# Patient Record
Sex: Male | Born: 1965 | Race: Black or African American | Hispanic: No | Smoking: Never smoker
Health system: Southern US, Community
[De-identification: ages and names within clinical notes are randomized; demographics above are authoritative.]

## PROBLEM LIST (undated history)

## (undated) DIAGNOSIS — K759 Inflammatory liver disease, unspecified: Secondary | ICD-10-CM

## (undated) HISTORY — PX: OTHER SURGICAL HISTORY: SHX169

---

## 2015-04-07 ENCOUNTER — Encounter (HOSPITAL_COMMUNITY)
Admission: EM | Disposition: A | Discharge status: Home / Self Care | Payer: Self-pay | Source: Home / Self Care | Attending: Family Medicine

## 2015-04-07 ENCOUNTER — Inpatient Hospital Stay (HOSPITAL_COMMUNITY)
Admission: EM | Admit: 2015-04-07 | Discharge: 2015-04-14 | DRG: 357 | Disposition: A | Discharge status: Home / Self Care | Payer: Self-pay | Attending: Internal Medicine | Admitting: Internal Medicine

## 2015-04-07 ENCOUNTER — Emergency Department (HOSPITAL_COMMUNITY): Payer: Self-pay

## 2015-04-07 ENCOUNTER — Encounter (HOSPITAL_COMMUNITY): Payer: Self-pay

## 2015-04-07 DIAGNOSIS — K226 Gastro-esophageal laceration-hemorrhage syndrome: Principal | ICD-10-CM | POA: Diagnosis present

## 2015-04-07 DIAGNOSIS — K7469 Other cirrhosis of liver: Secondary | ICD-10-CM | POA: Diagnosis present

## 2015-04-07 DIAGNOSIS — D62 Acute posthemorrhagic anemia: Secondary | ICD-10-CM | POA: Diagnosis present

## 2015-04-07 DIAGNOSIS — K8010 Calculus of gallbladder with chronic cholecystitis without obstruction: Secondary | ICD-10-CM | POA: Diagnosis present

## 2015-04-07 DIAGNOSIS — K920 Hematemesis: Secondary | ICD-10-CM

## 2015-04-07 DIAGNOSIS — K259 Gastric ulcer, unspecified as acute or chronic, without hemorrhage or perforation: Secondary | ICD-10-CM | POA: Diagnosis present

## 2015-04-07 DIAGNOSIS — D696 Thrombocytopenia, unspecified: Secondary | ICD-10-CM

## 2015-04-07 DIAGNOSIS — K922 Gastrointestinal hemorrhage, unspecified: Secondary | ICD-10-CM

## 2015-04-07 DIAGNOSIS — K2970 Gastritis, unspecified, without bleeding: Secondary | ICD-10-CM | POA: Diagnosis present

## 2015-04-07 DIAGNOSIS — B169 Acute hepatitis B without delta-agent and without hepatic coma: Secondary | ICD-10-CM | POA: Diagnosis present

## 2015-04-07 DIAGNOSIS — K2210 Ulcer of esophagus without bleeding: Secondary | ICD-10-CM

## 2015-04-07 DIAGNOSIS — D539 Nutritional anemia, unspecified: Secondary | ICD-10-CM

## 2015-04-07 DIAGNOSIS — Z23 Encounter for immunization: Secondary | ICD-10-CM

## 2015-04-07 DIAGNOSIS — B9681 Helicobacter pylori [H. pylori] as the cause of diseases classified elsewhere: Secondary | ICD-10-CM | POA: Diagnosis present

## 2015-04-07 DIAGNOSIS — Z8249 Family history of ischemic heart disease and other diseases of the circulatory system: Secondary | ICD-10-CM

## 2015-04-07 DIAGNOSIS — R945 Abnormal results of liver function studies: Secondary | ICD-10-CM

## 2015-04-07 DIAGNOSIS — Z6841 Body Mass Index (BMI) 40.0 and over, adult: Secondary | ICD-10-CM

## 2015-04-07 DIAGNOSIS — R7989 Other specified abnormal findings of blood chemistry: Secondary | ICD-10-CM

## 2015-04-07 HISTORY — PX: ESOPHAGOGASTRODUODENOSCOPY: SHX5428

## 2015-04-07 HISTORY — DX: Inflammatory liver disease, unspecified: K75.9

## 2015-04-07 LAB — COMPREHENSIVE METABOLIC PANEL
ALT: 406 U/L — ABNORMAL HIGH (ref 17–63)
ANION GAP: 5 (ref 5–15)
AST: 749 U/L — ABNORMAL HIGH (ref 15–41)
Albumin: 2.4 g/dL — ABNORMAL LOW (ref 3.5–5.0)
Alkaline Phosphatase: 142 U/L — ABNORMAL HIGH (ref 38–126)
BILIRUBIN TOTAL: 6 mg/dL — AB (ref 0.3–1.2)
BUN: 25 mg/dL — ABNORMAL HIGH (ref 6–20)
CO2: 28 mmol/L (ref 22–32)
Calcium: 8.2 mg/dL — ABNORMAL LOW (ref 8.9–10.3)
Chloride: 105 mmol/L (ref 101–111)
Creatinine, Ser: 0.95 mg/dL (ref 0.61–1.24)
GFR calc Af Amer: >60 mL/min (ref >60)
Glucose, Bld: 130 mg/dL — ABNORMAL HIGH (ref 65–99)
POTASSIUM: 4.3 mmol/L (ref 3.5–5.1)
Sodium: 138 mmol/L (ref 135–145)
TOTAL PROTEIN: 7.5 g/dL (ref 6.5–8.1)

## 2015-04-07 LAB — CBC
HCT: 28.1 % — ABNORMAL LOW (ref 39.0–52.0)
HEMATOCRIT: 27.6 % — AB (ref 39.0–52.0)
HEMOGLOBIN: 9.4 g/dL — AB (ref 13.0–17.0)
HEMOGLOBIN: 9.2 g/dL — AB (ref 13.0–17.0)
MCH: 34.7 pg — AB (ref 26.0–34.0)
MCH: 34.7 pg — ABNORMAL HIGH (ref 26.0–34.0)
MCHC: 33.5 g/dL (ref 30.0–36.0)
MCHC: 33.3 g/dL (ref 30.0–36.0)
MCV: 103.7 fL — ABNORMAL HIGH (ref 78.0–100.0)
MCV: 104.2 fL — AB (ref 78.0–100.0)
Platelets: 101 10*3/uL — ABNORMAL LOW (ref 150–400)
Platelets: 109 10*3/uL — ABNORMAL LOW (ref 150–400)
RBC: 2.71 MIL/uL — AB (ref 4.22–5.81)
RBC: 2.65 MIL/uL — AB (ref 4.22–5.81)
RDW: 16.6 % — ABNORMAL HIGH (ref 11.5–15.5)
RDW: 16.8 % — ABNORMAL HIGH (ref 11.5–15.5)
WBC: 11.5 10*3/uL — AB (ref 4.0–10.5)
WBC: 13.7 10*3/uL — AB (ref 4.0–10.5)

## 2015-04-07 LAB — VITAMIN B12: Vitamin B-12: 782 pg/mL (ref 180–914)

## 2015-04-07 LAB — URINALYSIS, ROUTINE W REFLEX MICROSCOPIC
Glucose, UA: NEGATIVE mg/dL
HGB URINE DIPSTICK: NEGATIVE
LEUKOCYTES UA: NEGATIVE
Nitrite: NEGATIVE
PROTEIN: NEGATIVE mg/dL
Specific Gravity, Urine: 1.02 (ref 1.005–1.030)
pH: 5.5 (ref 5.0–8.0)

## 2015-04-07 LAB — I-STAT CHEM 8, ED
BUN: 27 mg/dL — ABNORMAL HIGH (ref 6–20)
CALCIUM ION: 1.12 mmol/L (ref 1.12–1.23)
CHLORIDE: 103 mmol/L (ref 101–111)
Creatinine, Ser: 0.9 mg/dL (ref 0.61–1.24)
Glucose, Bld: 121 mg/dL — ABNORMAL HIGH (ref 65–99)
HCT: 37 % — ABNORMAL LOW (ref 39.0–52.0)
HEMOGLOBIN: 12.6 g/dL — AB (ref 13.0–17.0)
Potassium: 4.4 mmol/L (ref 3.5–5.1)
SODIUM: 143 mmol/L (ref 135–145)
TCO2: 26 mmol/L (ref 0–100)

## 2015-04-07 LAB — RETICULOCYTES
RBC.: 3.2 MIL/uL — AB (ref 4.22–5.81)
RETIC COUNT ABSOLUTE: 108.8 10*3/uL (ref 19.0–186.0)
RETIC CT PCT: 3.4 % — AB (ref 0.4–3.1)

## 2015-04-07 LAB — RAPID URINE DRUG SCREEN, HOSP PERFORMED
Amphetamines: NOT DETECTED
BARBITURATES: NOT DETECTED
Benzodiazepines: NOT DETECTED
COCAINE: NOT DETECTED
Opiates: NOT DETECTED
TETRAHYDROCANNABINOL: NOT DETECTED

## 2015-04-07 LAB — CBC WITH DIFFERENTIAL/PLATELET
Basophils Absolute: 0.1 10*3/uL (ref 0.0–0.1)
Basophils Relative: 0 %
Eosinophils Absolute: 0 10*3/uL (ref 0.0–0.7)
Eosinophils Relative: 0 %
HEMATOCRIT: 33.4 % — AB (ref 39.0–52.0)
Hemoglobin: 11 g/dL — ABNORMAL LOW (ref 13.0–17.0)
LYMPHS PCT: 21 %
Lymphs Abs: 2.5 10*3/uL (ref 0.7–4.0)
MCH: 34.4 pg — ABNORMAL HIGH (ref 26.0–34.0)
MCHC: 32.9 g/dL (ref 30.0–36.0)
MCV: 104.4 fL — AB (ref 78.0–100.0)
MONO ABS: 1.1 10*3/uL — AB (ref 0.1–1.0)
MONOS PCT: 9 %
NEUTROS ABS: 8.2 10*3/uL — AB (ref 1.7–7.7)
Neutrophils Relative %: 70 %
Platelets: 132 10*3/uL — ABNORMAL LOW (ref 150–400)
RBC: 3.2 MIL/uL — ABNORMAL LOW (ref 4.22–5.81)
RDW: 16.2 % — AB (ref 11.5–15.5)
WBC: 11.8 10*3/uL — ABNORMAL HIGH (ref 4.0–10.5)

## 2015-04-07 LAB — BILIRUBIN, FRACTIONATED(TOT/DIR/INDIR)
BILIRUBIN DIRECT: 2.6 mg/dL — AB (ref 0.1–0.5)
Indirect Bilirubin: 2.3 mg/dL — ABNORMAL HIGH (ref 0.3–0.9)
Total Bilirubin: 4.9 mg/dL — ABNORMAL HIGH (ref 0.3–1.2)

## 2015-04-07 LAB — IRON AND TIBC
Iron: 249 ug/dL — ABNORMAL HIGH (ref 45–182)
Saturation Ratios: 88 % — ABNORMAL HIGH (ref 17.9–39.5)
TIBC: 281 ug/dL (ref 250–450)
UIBC: 32 ug/dL

## 2015-04-07 LAB — TYPE AND SCREEN
ABO/RH(D): O POS
ANTIBODY SCREEN: NEGATIVE

## 2015-04-07 LAB — PROTIME-INR
INR: 1.36 (ref 0.00–1.49)
PROTHROMBIN TIME: 16.9 s — AB (ref 11.6–15.2)

## 2015-04-07 LAB — ETHANOL: Alcohol, Ethyl (B): <5 mg/dL (ref <5)

## 2015-04-07 LAB — POC OCCULT BLOOD, ED: FECAL OCCULT BLD: POSITIVE — AB

## 2015-04-07 LAB — LIPASE, BLOOD: Lipase: 18 U/L (ref 11–51)

## 2015-04-07 LAB — FERRITIN: FERRITIN: 230 ng/mL (ref 24–336)

## 2015-04-07 LAB — FOLATE: FOLATE: 10.9 ng/mL (ref >5.9)

## 2015-04-07 SURGERY — EGD (ESOPHAGOGASTRODUODENOSCOPY)
Anesthesia: Moderate Sedation

## 2015-04-07 MED ORDER — BUTAMBEN-TETRACAINE-BENZOCAINE 2-2-14 % EX AERO
INHALATION_SPRAY | CUTANEOUS | Status: DC | PRN
  Administered 2015-04-07: 2 via TOPICAL

## 2015-04-07 MED ORDER — SIMETHICONE 40 MG/0.6ML PO SUSP
ORAL | Status: DC | PRN
  Administered 2015-04-07: 2.5 mL

## 2015-04-07 MED ORDER — MEPERIDINE HCL 50 MG/ML IJ SOLN
INTRAMUSCULAR | Status: AC
  Filled 2015-04-07: qty 1

## 2015-04-07 MED ORDER — MIDAZOLAM HCL 5 MG/5ML IJ SOLN
INTRAMUSCULAR | Status: DC | PRN
  Administered 2015-04-07 (×4): 2 mg via INTRAVENOUS

## 2015-04-07 MED ORDER — DEXTROSE 5 % IV SOLN
1.0000 g | INTRAVENOUS | Status: DC
  Administered 2015-04-07 – 2015-04-12 (×6): 1 g via INTRAVENOUS
  Filled 2015-04-07 (×5): qty 10

## 2015-04-07 MED ORDER — MIDAZOLAM HCL 5 MG/5ML IJ SOLN
INTRAMUSCULAR | Status: AC
  Filled 2015-04-07: qty 10

## 2015-04-07 MED ORDER — SUCRALFATE 1 GM/10ML PO SUSP
1.0000 g | Freq: Three times a day (TID) | ORAL | Status: DC
  Administered 2015-04-07 – 2015-04-14 (×27): 1 g via ORAL
  Filled 2015-04-07 (×27): qty 10

## 2015-04-07 MED ORDER — DEXTROSE 5 % IV SOLN
INTRAVENOUS | Status: AC
  Filled 2015-04-07: qty 10

## 2015-04-07 MED ORDER — SODIUM CHLORIDE 0.9% FLUSH
3.0000 mL | Freq: Two times a day (BID) | INTRAVENOUS | Status: DC
  Administered 2015-04-07 – 2015-04-13 (×10): 3 mL via INTRAVENOUS

## 2015-04-07 MED ORDER — SODIUM CHLORIDE 0.9 % IV SOLN: INTRAVENOUS | Status: DC

## 2015-04-07 MED ORDER — INFLUENZA VAC SPLIT QUAD 0.5 ML IM SUSY
0.5000 mL | PREFILLED_SYRINGE | INTRAMUSCULAR | Status: AC
  Administered 2015-04-08: 0.5 mL via INTRAMUSCULAR
  Filled 2015-04-07: qty 0.5

## 2015-04-07 MED ORDER — SODIUM CHLORIDE 0.9 % IV BOLUS (SEPSIS)
2000.0000 mL | Freq: Once | INTRAVENOUS | Status: AC
  Administered 2015-04-07: 2000 mL via INTRAVENOUS

## 2015-04-07 MED ORDER — SODIUM CHLORIDE 0.9 % IV SOLN: 250.0000 mL | INTRAVENOUS | Status: DC | PRN

## 2015-04-07 MED ORDER — MEPERIDINE HCL 50 MG/ML IJ SOLN
INTRAMUSCULAR | Status: DC | PRN
  Administered 2015-04-07 (×2): 25 mg via INTRAVENOUS

## 2015-04-07 MED ORDER — PANTOPRAZOLE SODIUM 40 MG IV SOLR
40.0000 mg | Freq: Two times a day (BID) | INTRAVENOUS | Status: DC
  Administered 2015-04-07 – 2015-04-09 (×5): 40 mg via INTRAVENOUS
  Filled 2015-04-07 (×5): qty 40

## 2015-04-07 MED ORDER — METOCLOPRAMIDE HCL 5 MG/ML IJ SOLN
10.0000 mg | Freq: Once | INTRAMUSCULAR | Status: AC
  Administered 2015-04-07: 10 mg via INTRAVENOUS
  Filled 2015-04-07: qty 2

## 2015-04-07 MED ORDER — ONDANSETRON HCL 4 MG/2ML IJ SOLN: 4.0000 mg | Freq: Four times a day (QID) | INTRAMUSCULAR | Status: DC | PRN

## 2015-04-07 MED ORDER — PANTOPRAZOLE SODIUM 40 MG IV SOLR
40.0000 mg | Freq: Once | INTRAVENOUS | Status: AC
  Administered 2015-04-07: 40 mg via INTRAVENOUS

## 2015-04-07 NOTE — ED Provider Notes (Signed)
CSN: [id number]     Arrival date & time 04/07/15  [id number] History   First MD Initiated Contact with Patient 04/07/15 0750     Chief Complaint  Patient presents with  . GI Bleeding     (Consider location/radiation/quality/duration/timing/severity/associated sxs/prior Treatment) Patient is a 50 y.o. male presenting with vomiting. The history is provided by the patient (Patient states he started vomiting blood today and had some blood per rectum yesterday).  Emesis Severity:  Moderate Timing:  Constant Emesis appearance: Dark red blood. Able to tolerate:  Liquids Progression:  Unchanged Chronicity:  New Recent urination:  Normal Context: not post-tussive   Relieved by:  Nothing Associated symptoms: no abdominal pain, no diarrhea and no headaches     History reviewed. No pertinent past medical history. History reviewed. No pertinent past surgical history. No family history on file. Social History  Substance Use Topics  . Smoking status: Never Smoker   . Smokeless tobacco: None  . Alcohol Use: No    Review of Systems  Constitutional: Negative for appetite change and fatigue.  HENT: Negative for congestion, ear discharge and sinus pressure.   Eyes: Negative for discharge.  Respiratory: Negative for cough.   Cardiovascular: Negative for chest pain.  Gastrointestinal: Positive for vomiting. Negative for abdominal pain and diarrhea.       Vomiting blood and blood per rectum  Genitourinary: Negative for frequency and hematuria.  Musculoskeletal: Negative for back pain.  Skin: Negative for rash.  Neurological: Negative for seizures and headaches.  Psychiatric/Behavioral: Negative for hallucinations.      Allergies  Review of patient's allergies indicates no known allergies.  Home Medications   Prior to Admission medications   Not on File   BP 119/69 mmHg  Pulse 94  Temp(Src) 97.7 F (36.5 C) (Oral)  Resp 18  Ht 5\' 9"  (1.753 m)  Wt 330 lb (149.687 kg)  BMI 48.71  kg/m2  SpO2 100% Physical Exam  Constitutional: He is oriented to person, place, and time. He appears well-developed.  HENT:  Head: Normocephalic.  Eyes: Conjunctivae and EOM are normal. Scleral icterus is present.  Neck: Neck supple. No thyromegaly present.  Cardiovascular: Normal rate and regular rhythm.  Exam reveals no gallop and no friction rub.   No murmur heard. Pulmonary/Chest: No stridor. He has no wheezes. He has no rales. He exhibits no tenderness.  Abdominal: He exhibits no distension. There is no tenderness. There is no rebound.  Genitourinary:  Rectal exam normal except for dark red stool stool which was heme positive  Musculoskeletal: Normal range of motion. He exhibits no edema.  Lymphadenopathy:    He has no cervical adenopathy.  Neurological: He is oriented to person, place, and time. He exhibits normal muscle tone. Coordination normal.  Skin: No rash noted. No erythema.  Psychiatric: He has a normal mood and affect. His behavior is normal.    ED Course  Procedures (including critical care time) Labs Review Labs Reviewed  CBC WITH DIFFERENTIAL/PLATELET - Abnormal; Notable for the following:    WBC 11.8 (*)    RBC 3.20 (*)    Hemoglobin 11.0 (*)    HCT 33.4 (*)    MCV 104.4 (*)    MCH 34.4 (*)    RDW 16.2 (*)    Platelets 132 (*)    Neutro Abs 8.2 (*)    Monocytes Absolute 1.1 (*)    All other components within normal limits  COMPREHENSIVE METABOLIC PANEL - Abnormal; Notable for the following:  Glucose, Bld 130 (*)    BUN 25 (*)    Calcium 8.2 (*)    Albumin 2.4 (*)    AST 749 (*)    ALT 406 (*)    Alkaline Phosphatase 142 (*)    Total Bilirubin 6.0 (*)    All other components within normal limits  URINALYSIS, ROUTINE W REFLEX MICROSCOPIC (NOT AT [facility]) - Abnormal; Notable for the following:    Bilirubin Urine MODERATE (*)    Ketones, ur TRACE (*)    All other components within normal limits  RETICULOCYTES - Abnormal; Notable for the following:     Retic Ct Pct 3.4 (*)    RBC. 3.20 (*)    All other components within normal limits  I-STAT CHEM 8, ED - Abnormal; Notable for the following:    BUN 27 (*)    Glucose, Bld 121 (*)    Hemoglobin 12.6 (*)    HCT 37.0 (*)    All other components within normal limits  POC OCCULT BLOOD, ED - Abnormal; Notable for the following:    Fecal Occult Bld POSITIVE (*)    All other components within normal limits  VITAMIN B12  FOLATE  IRON AND TIBC  FERRITIN  LIPASE, BLOOD  PROTIME-INR  HEPATITIS PANEL, ACUTE  ETHANOL  URINE RAPID DRUG SCREEN, HOSP PERFORMED  TYPE AND SCREEN    Imaging Review No results found. I have personally reviewed and evaluated these images and lab results as part of my medical decision-making.   EKG Interpretation None     CRITICAL CARE Performed by: Arlean Thies L Total critical care time: 35 minutes Critical care time was exclusive of separately billable procedures and treating other patients. Critical care was necessary to treat or prevent imminent or life-threatening deterioration. Critical care was time spent personally by me on the following activities: development of treatment plan with patient and/or surrogate as well as nursing, discussions with consultants, evaluation of patient's response to treatment, examination of patient, obtaining history from patient or surrogate, ordering and performing treatments and interventions, ordering and review of laboratory studies, ordering and review of radiographic studies, pulse oximetry and re-evaluation of patient's condition.   MDM   Final diagnoses:  UGI bleed    Patient with upper GI bleed and elevated liver studies he will be admitted to medicine    Bethann Berkshire, MD 04/07/15 1001

## 2015-04-07 NOTE — H&P (Signed)
History and Physical  Jesus [name] [name] DOB: [birth year] DOA: 04/07/2015  Referring physician: Brendia Sacksaniel Nallely Jesus PCP: No primary care provider on file.   Chief Complaint: GI bleeding  HPI:  50 year old man with no known past medical history has not been seen by a primary care physician in some time who presents with acute onset of hematemesis. Initial laboratory studies revealed mixed elevation of LFTs, anemia and thrombocytopenia. Admitted for upper GI bleed, possible choledocholithiasis.  Patient has felt well until this morning at 2 AM he woke up vomiting blood. He had one additional episode in the emergency department. No nausea. No abdominal pain. No pain with eating. No previous history of ulcer or gastrointestinal problems. No blood with bowel movements. He did notice some blood in his urine. He denies NSAID use. No alcohol or drug use.  In the emergency department afebrile, vital signs stable. Pertinent labs: Total bilirubin 6, AST 7.9, ALT 406, alkaline phosphatase 142. Hemoglobin 11.8. Alcohol level negative. Fecal occult blood positive. Urine drug screen negative. EKG: Independently reviewed. Sinus tachycardia Imaging: Chest x-Renji no acute disease, abdominal film no acute disease. Right upper quadrant ultrasound cholelithiasis without cholecystitis.  Review of Systems:  Negative for fever, visual changes, sore throat, rash, new muscle aches, chest pain, SOB, dysuria, n/abdominal pain.  History reviewed. No pertinent past medical history. Denies any medical problems  Past Surgical History  Procedure Laterality Date  . None      Social History:  reports that he has never smoked. He does not have any smokeless tobacco history on file. He reports that he does not drink alcohol or use illicit drugs.   No Known Allergies  Family History  Problem Relation Age of Onset  . Hypertension Brother      Prior to Admission medications   Not on File  Does not take any  medications  Physical Exam: Filed Vitals:   04/07/15 1045 04/07/15 1100 04/07/15 1406 04/07/15 1419  BP:  98/63 122/61 128/72  Pulse: 108 97    Temp:   98.2 F (36.8 C)   TempSrc:   Oral   Resp:   16   Height:      Weight:      SpO2: 100% 100% 100%    General:  Appears calm and comfortable Eyes: PERRL, normal lids, irises & conjunctiva ENT: grossly normal hearing, lips & tongue Neck: no LAD, masses or thyromegaly Cardiovascular: RRR, no m/r/g. No LE edema. Respiratory: CTA bilaterally, no w/r/r. Normal respiratory effort. Abdomen: soft, Obese, nontender, no right upper quadrant pain Skin: no rash or induration noted Musculoskeletal: grossly normal tone BUE/BLE Psychiatric: grossly normal mood and affect, speech fluent and appropriate Neurologic: grossly non-focal.   Wt Readings from Last 3 Encounters:  04/07/15 149.687 kg (330 lb)    Labs on Admission:  Basic Metabolic Panel:  Recent Labs Lab 04/07/15 0755 04/07/15 0846  NA 138 143  K 4.3 4.4  CL 105 103  CO2 28  --   GLUCOSE 130* 121*  BUN 25* 27*  CREATININE 0.95 0.90  CALCIUM 8.2*  --     Liver Function Tests:  Recent Labs Lab 04/07/15 0755  AST 749*  ALT 406*  ALKPHOS 142*  BILITOT 6.0*  PROT 7.5  ALBUMIN 2.4*    Recent Labs Lab 04/07/15 0755  LIPASE 18    CBC:  Recent Labs Lab 04/07/15 0755 04/07/15 0846  WBC 11.8*  --   NEUTROABS 8.2*  --   HGB 11.0* 12.6*  HCT 33.4*  37.0*  MCV 104.4*  --   PLT 132*  --     Radiological Exams on Admission: Dg Abd Acute W/chest  04/07/2015  CLINICAL DATA:  Hematuria, vomiting blood, fever EXAM: DG ABDOMEN ACUTE W/ 1V CHEST COMPARISON:  None. FINDINGS: No active infiltrate or effusion is seen. Mediastinal and hilar contours are unremarkable. The heart is borderline enlarged. Supine and erect views of the abdomen show no bowel obstruction. No free air is seen on the best erect view possible. Calcifications are present within the right upper  quadrant which could represent calcified gallstones, not appearing to overlay the right renal shadow. There are degenerative changes in the lower lumbar spine. IMPRESSION: 1. No bowel obstruction.  No free air on the best films possible. 2. Question gallstones in the right upper quadrant. 3. No active lung disease. Electronically Signed   By: Dwyane Dee M.D.   On: 04/07/2015 10:36   US Abdomen Limited Ruq  04/07/2015  CLINICAL DATA:  50 year old male the history of mid abdominal pain and rectal bleeding. EXAM: US ABDOMEN LIMITED - RIGHT UPPER QUADRANT COMPARISON:  Plain film 04/07/2015 FINDINGS: Gallbladder: Multiple echogenic foci within the lumen of the gallbladder. Gallbladder wall measures approximately 4 mm -5 mm. No pericholecystic fluid. Sonographic Murphy's sign negative. Common bile duct: Diameter: 5 mm Liver: Heterogeneous echotexture of the liver. IMPRESSION: Sonographic survey demonstrates cholelithiasis without sonographic evidence of acute cholecystitis. Liver steatosis. Signed, Yvone Neu. Loreta Ave, DO [facility] Electronically Signed   By: Gilmer Mor D.O.   On: 04/07/2015 11:05      Principal Problem:   UGI bleed Active Problems:   Hematemesis   Elevated LFTs   Thrombocytopenia (HCC)   Macrocytic anemia   Assessment/Plan 1. Hematemesis, suspected UGIB. Hgb 12.6, hemodynamics stable. Fecal occult blood positive. 2. Elevated LFTs: mixed pattern, transaminases, AP and total bilirubin. Consider choledocholithiasis. Right upper quadrant ultrasound showed cholelithiasis without cholecystitis. Acute abdominal series unremarkable. Lipase normal. 3. Macrocytic anemia, thrombocytopenia. Baseline unknown. Possibly related to underlying liver disease/fatty liver?   Obs to medical bed  Trend hemoglobin  Check acute hepatitis panel, alcohol level  GI consult  Code Status: full code  DVT prophylaxis:SCDs Family  Communication: brother with patient permission Disposition Plan/Anticipated LOS: discharge once improved  Time spent: 80 minutes  Brendia Sacks, MD  [facility] Pager [phone] 04/07/2015, 2:21 PM  By signing my name below, I, Adron Bene, attest that this documentation has been prepared under the direction and in the presence of Neriyah Cercone P. Irene Limbo, MD. Electronically Signed: Adron Bene, Scribe.  04/07/2015

## 2015-04-07 NOTE — Consult Note (Addendum)
Referring Provider: Melton Alar.Irene Limbo, MD Primary Care Physician:  No primary care provider on file. Primary Gastroenterologist:  Dr. Karilyn Cota  Reason for Consultation:   Upper GI bleed and abnormal LFTs.  HPI:   Patient is 50 year old African male who has been usual state of health until early this morning when he woke up around 2 AM to avoid and felt sick. He had nausea vomiting and noted dark red blood. Around the same time he noted upper and mid abdominal pain. He noticed urine to be orange. He had one or 2 more episodes of emesis. Felt weak. He came to emergency room around 8 AM. He had no episode of hematemesis while in emergency room. He was noted to have low hemoglobin. He was also noted to have elevated troponin LFTs. Upper abdominal ultrasound was obtained which suggested fatty liver and cholelithiasis. Gallbladder wall is thickened. Bile duct was nondilated. Patient was admitted to ICU for further management. Patient not has mild pain in epigastric region. He denies chronic heartburn or dysphagia. He has had good appetite and has not lost any weight recently. He does not take OTC NSAIDs or other medications. He did have bowel movement this morning before he came to the hospital. Was black formed stool. He states he felt well all day yesterday and has 3 meals. He does not drink alcohol or smoke cigarettes. He is divorced. He has been working as a Naval architect since 8119. He has 3 grownup children. He has 2 sons and daughter. His daughter Ezequiel Essex is at bedside. His father died at age 81 1 week after sustaining abdominal injury at salt meal.. He apparently had internal injury. Her mother died of MI at age 5. He has 2 brothers and 4 sisters. One brother is hypertensive. One brother and one sister has been treated for peptic ulcer disease/upper GI bleed.   Past medical history; Obesity.  Past Surgical History  Procedure Laterality Date  . None      Prior to Admission medications    Not on File    Current Facility-Administered Medications  Medication Dose Route Frequency Provider Last Rate Last Dose  . [START ON 04/08/2015] Influenza vac split quadrivalent PF (FLUARIX) injection 0.5 mL  0.5 mL Intramuscular Tomorrow-1000 Standley Brooking, MD        Allergies as of 04/07/2015  . (No Known Allergies)    Family History  Problem Relation Age of Onset  . Hypertension Brother     Social History   Social History  . Marital Status: Married    Spouse Name: N/A  . Number of Children: N/A  . Years of Education: N/A   Occupational History  . Not on file.   Social History Main Topics  . Smoking status: Never Smoker   . Smokeless tobacco: Not on file  . Alcohol Use: No  . Drug Use: No  . Sexual Activity: Not on file   Other Topics Concern  . Not on file   Social History Narrative  . No narrative on file    Review of Systems: See HPI, otherwise normal ROS  Physical Exam: Temp:  [97.7 F (36.5 C)] 97.7 F (36.5 C) (03/15 0733) Pulse Rate:  [92-108] 97 (03/15 1100) Resp:  [18-20] 18 (03/15 0944) BP: (98-122)/(63-72) 98/63 mmHg (03/15 1100) SpO2:  [99 %-100 %] 100 % (03/15 1100) Weight:  [330 lb (149.687 kg)] 330 lb (149.687 kg) (03/15 0733) Last BM Date: 04/07/15 Pleasant well-developed these have her medically male in NAD. Conjunctiva was  pink. Sclerae mildly icteric. Oropharyngeal mucosa is normal. Neck masses or thyromegaly noted. Cardiac exam with regular rhythm normal S1 and S2. No murmur or gallop noted. Abdomen is full with normal bowel sounds. On palpation it soft. He has mild midepigastric tenderness. No organomegaly or masses. Rectal examination deferred.   Lab Results:  Recent Labs  04/07/15 0755 04/07/15 0846  WBC 11.8*  --   HGB 11.0* 12.6*  HCT 33.4* 37.0*  PLT 132*  --    BMET  Recent Labs  04/07/15 0755 04/07/15 0846  NA 138 143  K 4.3 4.4  CL 105 103  CO2 28  --   GLUCOSE 130* 121*  BUN 25* 27*  CREATININE  0.95 0.90  CALCIUM 8.2*  --    LFT  Recent Labs  04/07/15 0755  PROT 7.5  ALBUMIN 2.4*  AST 749*  ALT 406*  ALKPHOS 142*  BILITOT 6.0*   PT/INR  Recent Labs  04/07/15 0755  LABPROT 16.9*  INR 1.36   Hepatitis Panel Pending..  Serum lipase 18 INR 1.36.  Studies/Results: Dg Abd Acute W/chest  04/07/2015  CLINICAL DATA:  Hematuria, vomiting blood, fever EXAM: DG ABDOMEN ACUTE W/ 1V CHEST COMPARISON:  None. FINDINGS: No active infiltrate or effusion is seen. Mediastinal and hilar contours are unremarkable. The heart is borderline enlarged. Supine and erect views of the abdomen show no bowel obstruction. No free air is seen on the best erect view possible. Calcifications are present within the right upper quadrant which could represent calcified gallstones, not appearing to overlay the right renal shadow. There are degenerative changes in the lower lumbar spine. IMPRESSION: 1. No bowel obstruction.  No free air on the best films possible. 2. Question gallstones in the right upper quadrant. 3. No active lung disease. Electronically Signed   By: Dwyane Dee M.D.   On: 04/07/2015 10:36   US Abdomen Limited Ruq  04/07/2015  CLINICAL DATA:  50 year old male the history of mid abdominal pain and rectal bleeding. EXAM: US ABDOMEN LIMITED - RIGHT UPPER QUADRANT COMPARISON:  Plain film 04/07/2015 FINDINGS: Gallbladder: Multiple echogenic foci within the lumen of the gallbladder. Gallbladder wall measures approximately 4 mm -5 mm. No pericholecystic fluid. Sonographic Murphy's sign negative. Common bile duct: Diameter: 5 mm Liver: Heterogeneous echotexture of the liver. IMPRESSION: Sonographic survey demonstrates cholelithiasis without sonographic evidence of acute cholecystitis. Liver steatosis. Signed, Yvone Neu. Loreta Ave, DO [facility] Electronically Signed   By: Gilmer Mor D.O.   On: 04/07/2015 11:05   Ultrasound reviewed with Dr.  Ulyses Southward.  Assessment;  Patient is 50 year old African American male who presents with acute onset of nausea vomiting hematemesis melena and also noted to have elevated bilirubin and transaminases. Ultrasound reveals cholelithiasis but CBD is not dilated. Patient does not take OTC medications. He also does not drink alcohol. I suspect acute illness is secondary to choledocholithiasis resulting in nausea vomiting resulting in Mallory-Weiss tear and upper GI bleed. Peptic ulcer disease is also possible. Since CBD is nondilated it is quite possible that he did have passed a stone. Whether or not he would need further imaging studies are ERCP will depend on his lab studies from tomorrow morning. Gallbladder wall is thickened and he therefore may also have acute cholecystitis.    Recommendations;  Pantoprazole 40 mg IV every 12 hours. Ceftriaxone 1 g IV every 24 hours. Diagnostic esophagogastroduodenoscopy today. Surgical consultation. Repeat LFTs in a.m.     Priya Matsen U  04/07/2015,  1:01 PM

## 2015-04-07 NOTE — ED Notes (Signed)
Pt reports dark stools for the past few days and started vomiting dark red blood yesterday.  Reports had to give a urine sample yesterday for a job and was told he also had blood in urine.  Pt denies pain but c/o generalized weakness.

## 2015-04-07 NOTE — ED Notes (Signed)
Pt had 2 episodes dark red hematemesis.

## 2015-04-07 NOTE — Op Note (Signed)
[facility] Patient Name: Jesus [name] Procedure Date: 04/07/2015 2:45 PM MRN: [id number] Date of Birth: 1966/01/16 Attending MD: Lionel December , MD CSN: [id number] Age: 50 Admit Type: Inpatient Procedure:                Upper GI endoscopy Indications:              Active gastrointestinal bleeding Providers:                Lionel December, MD, Nena Polio, RN, Birder Robson,                            Technician Referring MD:              Medicines:                Cetacaine spray, Meperidine 50 mg IV Complications:            No immediate complications. Estimated Blood Loss:     Estimated blood loss: none. Estimated blood loss:                            none. Procedure:                Pre-Anesthesia Assessment:                           - Prior to the procedure, a History and Physical                            was performed, and patient medications and                            allergies were reviewed. The patient's tolerance of                            previous anesthesia was also reviewed. The risks                            and benefits of the procedure and the sedation                            options and risks were discussed with the patient.                            All questions were answered, and informed consent                            was obtained. Prior Anticoagulants: The patient has                            taken no previous anticoagulant or antiplatelet                            agents. ASA Grade Assessment: I - A normal, healthy  patient. After reviewing the risks and benefits,                            the patient was deemed in satisfactory condition to                            undergo the procedure.                           After obtaining informed consent, the endoscope was                            passed under direct vision. Throughout the                            procedure, the patient's blood pressure, pulse,  and                            oxygen saturations were monitored continuously. The                            [id number] ([id number] scope was introduced through the                            mouth, and advanced to the second part of duodenum.                            The upper GI endoscopy was accomplished without                            difficulty. The patient tolerated the procedure                            well. Scope In: 3:09:00 PM Scope Out: 3:14:06 PM Total Procedure Duration: 0 hours 5 minutes 6 seconds  Findings:      One linear esophageal ulcer with no bleeding and no stigmata of recent       bleeding was found 41 cm from the incisors. The lesion was 15 mm in       largest dimension.      The upper third of the esophagus and middle third of the esophagus were       normal. Estimated blood loss: none.      Three non-bleeding superficial gastric ulcers with no stigmata of       bleeding were found in the gastric antrum. The largest lesion was 4 mm       in largest dimension.      One non-bleeding cratered gastric ulcer with no stigmata of bleeding was       found in the prepyloric region of the stomach. The lesion was 10 mm in       largest dimension.      The cardia and gastric fundus were normal on retroflexion.      The duodenal bulb and second portion of the duodenum were normal. Impression:               - linear esophageal ulceracross GE  junction to his                            lesser curvature consistent with Mallory-Weiss tear.                           - Normal upper third of esophagus and middle third                            of esophagus.                           - Three small antral ulcers and10 mm prepyloric                            ulcer noted without stigmata of bleed.                           - No specimens collected. Moderate Sedation:      Moderate (conscious) sedation was administered by the endoscopy nurse       and supervised by the  endoscopist. The following parameters were       monitored: oxygen saturation, heart rate, blood pressure, CO2       capnography and response to care. Total physician intraservice time was       15 minutes. Recommendation:           - Clear liquid diet today.                           - Use sucralfate suspension 1 gram PO QID today.                           - Return patient to ICU for ongoing care.                           - Perform an H. pylori serology today.                           - No aspirin, ibuprofen, naproxen, or other                            non-steroidal anti-inflammatory drugs for 2 weeks.                           - Repeat upper endoscopy in 3 months to check                            healing. Procedure Code(s):        --- Professional ---                           [phone], Esophagogastroduodenoscopy, flexible,                            transoral; diagnostic, including collection of  specimen(s) by brushing or washing, when performed                            (separate procedure)                           99152, Moderate sedation services provided by the                            same physician or other qualified health care                            professional performing the diagnostic or                            therapeutic service that the sedation supports,                            requiring the presence of an independent trained                            observer to assist in the monitoring of the                            patient's level of consciousness and physiological                            status; initial 15 minutes of intraservice time,                            patient age 19 years or older Diagnosis Code(s):        --- Professional ---                           K22.10, Ulcer of esophagus without bleeding                           K92.2, Gastrointestinal hemorrhage, unspecified CPT copyright 2016 American Medical  Association. All rights reserved. The codes documented in this report are preliminary and upon coder review may  be revised to meet current compliance requirements. Lionel December, MD Lionel December, MD 04/07/2015 3:44:10 PM This report has been signed electronically. Number of Addenda: 0

## 2015-04-08 DIAGNOSIS — K2210 Ulcer of esophagus without bleeding: Secondary | ICD-10-CM

## 2015-04-08 LAB — CBC
HCT: 27.9 % — ABNORMAL LOW (ref 39.0–52.0)
HCT: 26.4 % — ABNORMAL LOW (ref 39.0–52.0)
HEMOGLOBIN: 9.4 g/dL — AB (ref 13.0–17.0)
Hemoglobin: 8.9 g/dL — ABNORMAL LOW (ref 13.0–17.0)
MCH: 34.8 pg — ABNORMAL HIGH (ref 26.0–34.0)
MCH: 35.3 pg — ABNORMAL HIGH (ref 26.0–34.0)
MCHC: 33.7 g/dL (ref 30.0–36.0)
MCHC: 33.7 g/dL (ref 30.0–36.0)
MCV: 103.3 fL — ABNORMAL HIGH (ref 78.0–100.0)
MCV: 104.8 fL — AB (ref 78.0–100.0)
PLATELETS: 111 10*3/uL — AB (ref 150–400)
PLATELETS: 106 10*3/uL — AB (ref 150–400)
RBC: 2.7 MIL/uL — ABNORMAL LOW (ref 4.22–5.81)
RBC: 2.52 MIL/uL — AB (ref 4.22–5.81)
RDW: 16.9 % — AB (ref 11.5–15.5)
RDW: 17 % — AB (ref 11.5–15.5)
WBC: 11 10*3/uL — ABNORMAL HIGH (ref 4.0–10.5)
WBC: 9.5 10*3/uL (ref 4.0–10.5)

## 2015-04-08 LAB — COMPREHENSIVE METABOLIC PANEL
ALBUMIN: 2.1 g/dL — AB (ref 3.5–5.0)
ALK PHOS: 113 U/L (ref 38–126)
ALT: 318 U/L — AB (ref 17–63)
ANION GAP: 4 — AB (ref 5–15)
AST: 611 U/L — ABNORMAL HIGH (ref 15–41)
BILIRUBIN TOTAL: 4.7 mg/dL — AB (ref 0.3–1.2)
BUN: 21 mg/dL — ABNORMAL HIGH (ref 6–20)
CALCIUM: 7.9 mg/dL — AB (ref 8.9–10.3)
CO2: 27 mmol/L (ref 22–32)
CREATININE: 0.97 mg/dL (ref 0.61–1.24)
Chloride: 107 mmol/L (ref 101–111)
GFR calc Af Amer: >60 mL/min (ref >60)
GFR calc non Af Amer: >60 mL/min (ref >60)
GLUCOSE: 100 mg/dL — AB (ref 65–99)
Potassium: 3.8 mmol/L (ref 3.5–5.1)
Sodium: 138 mmol/L (ref 135–145)
TOTAL PROTEIN: 6.3 g/dL — AB (ref 6.5–8.1)

## 2015-04-08 LAB — H. PYLORI ANTIBODY, IGG: H Pylori IgG: 7.4 U/mL — ABNORMAL HIGH (ref 0.0–0.8)

## 2015-04-08 LAB — HEPATITIS PANEL, ACUTE
HCV Ab: 0.2 s/co ratio (ref 0.0–0.9)
HEP A IGM: NEGATIVE
HEP B C IGM: NEGATIVE
HEP B S AG: POSITIVE — AB

## 2015-04-08 NOTE — Progress Notes (Addendum)
States he feels 50% better. Did have a black stool yesterday evening which was expected. Underwent an EGD yesterday by Dr. Laural Golden which revealed linear esophageal ulcer across GE junction to his lesser curvature consistent with Mallory-Weiss tear. Normal upper third of esophagus and middle third of esophagus. Three small ulcers and 10 mm prepyloric ulcer noted without stigmata of bleed.  This morning he has  nausea or vomiting. He has slight epigastric tenderness but abdomen is non-distended. Liver numbers are slowly coming down. He deneis prior hx of IV drugs.   Hepatic Function Latest Ref Rng 04/08/2015 04/07/2015 04/07/2015  Total Protein 6.5 - 8.1 g/dL 6.3(L) - 7.5  Albumin 3.5 - 5.0 g/dL 2.1(L) - 2.4(L)  AST 15 - 41 U/L 611(H) - 749(H)  ALT 17 - 63 U/L 318(H) - 406(H)  Alk Phosphatase 38 - 126 U/L 113 - 142(H)  Total Bilirubin 0.3 - 1.2 mg/dL 4.7(H) 4.9(H) 6.0(H)  Bilirubin, Direct 0.1 - 0.5 mg/dL - 2.6(H) -   Blood pressure 122/58, pulse 88, temperature 98.2 F (36.8 C), temperature source Oral, resp. rate 20, height 5' 9"  (1.753 m), weight 333 lb 5.4 oz (151.2 kg), SpO2 100 %. CBC Latest Ref Rng 04/08/2015 04/07/2015 04/07/2015  WBC 4.0 - 10.5 K/uL 11.0(H) 13.7(H) 11.5(H)  Hemoglobin 13.0 - 17.0 g/dL 9.4(L) 9.2(L) 9.4(L)  Hematocrit 39.0 - 52.0 % 27.9(L) 27.6(L) 28.1(L)  Platelets 150 - 400 K/uL 111(L) 109(L) 101(L)   UGI Bleed: He says he is 50% better at this time. Underwent EGD yesterday. H. pylori positive. Will start tx at [place].  Cholelithiasis: He possible passed a stone.  Liver enzymes are slowly coming down. Acute Hepatitis panel is pending. Will continue to monitor.      GI attending note:  As above patient is feeling better. He denies nausea vomiting or abdominal pain. He does not feel as weak as he did yesterday. He stated stool distal black. Bilirubin and transaminases have improved since yesterday. Serum albumin is 21 and his platelet count is low. He therefore could have  underlying liver disease or cirrhosis. Vital marker studies are pending. He could have chronic liver disease secondary to NASH. Will ask Dr. Arnoldo Morale to do liver biopsy at the time of surgery. H. pylori serology is positive. He will be treated at a later date.  Will advance diet to full liquids. Continue IV pantoprazole for another 24 hours. Patient for repeat labs in a.m.

## 2015-04-08 NOTE — Clinical Social Work Note (Signed)
CSW left a voicemail message for Fransisca ConnorsAshley Hilton, financial counselor, advising that patient's family wanted to discuss applying for Medicaid.     CSW signing off.  Reis Goga, Juleen ChinaHeather D, LCSW

## 2015-04-08 NOTE — Consult Note (Signed)
Reason for Consult: Cholecystitis, cholelithiasis Referring Physician: Hospitalist  Jesus [name] is an 50 y.o. male.  HPI: Patient is a 50 year old morbidly obese black male who presented emergency room with worsening nausea and vomiting. He was also noted to have hematemesis and anemia. His liver enzyme tests were significantly elevated. Workup reveals cholelithiasis with possible dilated common bile duct. Due to the hematemesis and low hemoglobin, he underwent an EGD by Dr. Laural Golden yesterday. No frank bleeding was noted. Today, he has less nausea and no abdominal pain. His liver enzyme tests are slowly improving. I agree that he may have passed a stone. Patient gives no pertinent past medical history.  History reviewed. No pertinent past medical history.  Past Surgical History  Procedure Laterality Date  . None      Family History  Problem Relation Age of Onset  . Hypertension Brother     Social History:  reports that he has never smoked. He does not have any smokeless tobacco history on file. He reports that he does not drink alcohol or use illicit drugs.  Allergies: No Known Allergies  Medications: I have reviewed the patient's current medications.  Results for orders placed or performed during the hospital encounter of 04/07/15 (from the past 48 hour(s))  CBC with Differential     Status: Abnormal   Collection Time: 04/07/15  7:55 AM  Result Value Ref Range   WBC 11.8 (H) 4.0 - 10.5 K/uL   RBC 3.20 (L) 4.22 - 5.81 MIL/uL   Hemoglobin 11.0 (L) 13.0 - 17.0 g/dL   HCT 33.4 (L) 39.0 - 52.0 %   MCV 104.4 (H) 78.0 - 100.0 fL   MCH 34.4 (H) 26.0 - 34.0 pg   MCHC 32.9 30.0 - 36.0 g/dL   RDW 16.2 (H) 11.5 - 15.5 %   Platelets 132 (L) 150 - 400 K/uL   Neutrophils Relative % 70 %   Neutro Abs 8.2 (H) 1.7 - 7.7 K/uL   Lymphocytes Relative 21 %   Lymphs Abs 2.5 0.7 - 4.0 K/uL   Monocytes Relative 9 %   Monocytes Absolute 1.1 (H) 0.1 - 1.0 K/uL   Eosinophils Relative 0 %   Eosinophils Absolute 0.0 0.0 - 0.7 K/uL   Basophils Relative 0 %   Basophils Absolute 0.1 0.0 - 0.1 K/uL  Comprehensive metabolic panel     Status: Abnormal   Collection Time: 04/07/15  7:55 AM  Result Value Ref Range   Sodium 138 135 - 145 mmol/L   Potassium 4.3 3.5 - 5.1 mmol/L   Chloride 105 101 - 111 mmol/L   CO2 28 22 - 32 mmol/L   Glucose, Bld 130 (H) 65 - 99 mg/dL   BUN 25 (H) 6 - 20 mg/dL   Creatinine, Ser 0.95 0.61 - 1.24 mg/dL   Calcium 8.2 (L) 8.9 - 10.3 mg/dL   Total Protein 7.5 6.5 - 8.1 g/dL   Albumin 2.4 (L) 3.5 - 5.0 g/dL   AST 749 (H) 15 - 41 U/L   ALT 406 (H) 17 - 63 U/L   Alkaline Phosphatase 142 (H) 38 - 126 U/L   Total Bilirubin 6.0 (H) 0.3 - 1.2 mg/dL   GFR calc non Af Amer >60 >60 mL/min   GFR calc Af Amer >60 >60 mL/min    Comment: (NOTE) The eGFR has been calculated using the CKD EPI equation. This calculation has not been validated in all clinical situations. eGFR's persistently <60 mL/min signify possible Chronic Kidney Disease.  Anion gap 5 5 - 15  Type and screen     Status: None   Collection Time: 04/07/15  7:55 AM  Result Value Ref Range   ABO/RH(D) O POS    Antibody Screen NEG    Sample Expiration 04/10/2015   Vitamin B12     Status: None   Collection Time: 04/07/15  7:55 AM  Result Value Ref Range   Vitamin B-12 782 180 - 914 pg/mL    Comment: (NOTE) This assay is not validated for testing neonatal or myeloproliferative syndrome specimens for Vitamin B12 levels. Performed at [facility]   Iron and TIBC     Status: Abnormal   Collection Time: 04/07/15  7:55 AM  Result Value Ref Range   Iron 249 (H) 45 - 182 ug/dL   TIBC 281 250 - 450 ug/dL   Saturation Ratios 88 (H) 17.9 - 39.5 %   UIBC 32 ug/dL    Comment: Performed at [facility]  Ferritin     Status: None   Collection Time: 04/07/15  7:55 AM  Result Value Ref Range   Ferritin 230 24 - 336 ng/mL    Comment: Performed at [facility]  Reticulocytes      Status: Abnormal   Collection Time: 04/07/15  7:55 AM  Result Value Ref Range   Retic Ct Pct 3.4 (H) 0.4 - 3.1 %   RBC. 3.20 (L) 4.22 - 5.81 MIL/uL   Retic Count, Manual 108.8 19.0 - 186.0 K/uL  Lipase, blood     Status: None   Collection Time: 04/07/15  7:55 AM  Result Value Ref Range   Lipase 18 11 - 51 U/L  Protime-INR     Status: Abnormal   Collection Time: 04/07/15  7:55 AM  Result Value Ref Range   Prothrombin Time 16.9 (H) 11.6 - 15.2 seconds   INR 1.36 0.00 - 1.49  Ethanol     Status: None   Collection Time: 04/07/15  7:55 AM  Result Value Ref Range   Alcohol, Ethyl (B) <5 <5 mg/dL    Comment:        LOWEST DETECTABLE LIMIT FOR SERUM ALCOHOL IS 5 mg/dL FOR MEDICAL PURPOSES ONLY   Urinalysis, Routine w reflex microscopic (not at [facility])     Status: Abnormal   Collection Time: 04/07/15  8:18 AM  Result Value Ref Range   Color, Urine YELLOW YELLOW   APPearance CLEAR CLEAR   Specific Gravity, Urine 1.020 1.005 - 1.030   pH 5.5 5.0 - 8.0   Glucose, UA NEGATIVE NEGATIVE mg/dL   Hgb urine dipstick NEGATIVE NEGATIVE   Bilirubin Urine MODERATE (A) NEGATIVE   Ketones, ur TRACE (A) NEGATIVE mg/dL   Protein, ur NEGATIVE NEGATIVE mg/dL   Nitrite NEGATIVE NEGATIVE   Leukocytes, UA NEGATIVE NEGATIVE    Comment: MICROSCOPIC NOT DONE ON URINES WITH NEGATIVE PROTEIN, BLOOD, LEUKOCYTES, NITRITE, OR GLUCOSE <1000 mg/dL.  Folate     Status: None   Collection Time: 04/07/15  8:23 AM  Result Value Ref Range   Folate 10.9 >5.9 ng/mL    Comment: Performed at [facility]  POC occult blood, ED     Status: Abnormal   Collection Time: 04/07/15  8:33 AM  Result Value Ref Range   Fecal Occult Bld POSITIVE (A) NEGATIVE  I-stat chem 8, ed     Status: Abnormal   Collection Time: 04/07/15  8:46 AM  Result Value Ref Range   Sodium 143 135 -  145 mmol/L   Potassium 4.4 3.5 - 5.1 mmol/L   Chloride 103 101 - 111 mmol/L   BUN 27 (H) 6 - 20 mg/dL   Creatinine, Ser 0.90 0.61 - 1.24 mg/dL    Glucose, Bld 121 (H) 65 - 99 mg/dL   Calcium, Ion 1.12 1.12 - 1.23 mmol/L   TCO2 26 0 - 100 mmol/L   Hemoglobin 12.6 (L) 13.0 - 17.0 g/dL   HCT 37.0 (L) 39.0 - 52.0 %  Urine rapid drug screen (hosp performed)     Status: None   Collection Time: 04/07/15  9:50 AM  Result Value Ref Range   Opiates NONE DETECTED NONE DETECTED   Cocaine NONE DETECTED NONE DETECTED   Benzodiazepines NONE DETECTED NONE DETECTED   Amphetamines NONE DETECTED NONE DETECTED   Tetrahydrocannabinol NONE DETECTED NONE DETECTED   Barbiturates NONE DETECTED NONE DETECTED    Comment:        DRUG SCREEN FOR MEDICAL PURPOSES ONLY.  IF CONFIRMATION IS NEEDED FOR ANY PURPOSE, NOTIFY LAB WITHIN 5 DAYS.        LOWEST DETECTABLE LIMITS FOR URINE DRUG SCREEN Drug Class       Cutoff (ng/mL) Amphetamine      1000 Barbiturate      200 Benzodiazepine   517 Tricyclics       616 Opiates          300 Cocaine          300 THC              50   H. pylori antibody, IgG     Status: Abnormal   Collection Time: 04/07/15  4:00 PM  Result Value Ref Range   H Pylori IgG 7.4 (H) 0.0 - 0.8 U/mL    Comment: (NOTE)                             Negative            <0.9                             Indeterminate  0.9 - 1.0                             Positive            >1.0 Performed At: [facility] [place], [place] [id number] Lindon Romp MD [id number]   Bilirubin, fractionated(tot/dir/indir)     Status: Abnormal   Collection Time: 04/07/15  4:00 PM  Result Value Ref Range   Total Bilirubin 4.9 (H) 0.3 - 1.2 mg/dL   Bilirubin, Direct 2.6 (H) 0.1 - 0.5 mg/dL   Indirect Bilirubin 2.3 (H) 0.3 - 0.9 mg/dL  CBC     Status: Abnormal   Collection Time: 04/07/15  4:00 PM  Result Value Ref Range   WBC 11.5 (H) 4.0 - 10.5 K/uL   RBC 2.71 (L) 4.22 - 5.81 MIL/uL   Hemoglobin 9.4 (L) 13.0 - 17.0 g/dL    Comment: DELTA CHECK NOTED   HCT 28.1 (L) 39.0 - 52.0 %   MCV 103.7 (H) 78.0 - 100.0 fL   MCH 34.7  (H) 26.0 - 34.0 pg   MCHC 33.5 30.0 - 36.0 g/dL   RDW 16.6 (H) 11.5 - 15.5 %   Platelets 101 (L) 150 - 400  K/uL    Comment: SPECIMEN CHECKED FOR CLOTS CONSISTENT WITH PREVIOUS RESULT   CBC     Status: Abnormal   Collection Time: 04/07/15  8:39 PM  Result Value Ref Range   WBC 13.7 (H) 4.0 - 10.5 K/uL   RBC 2.65 (L) 4.22 - 5.81 MIL/uL   Hemoglobin 9.2 (L) 13.0 - 17.0 g/dL   HCT 27.6 (L) 39.0 - 52.0 %   MCV 104.2 (H) 78.0 - 100.0 fL   MCH 34.7 (H) 26.0 - 34.0 pg   MCHC 33.3 30.0 - 36.0 g/dL   RDW 16.8 (H) 11.5 - 15.5 %   Platelets 109 (L) 150 - 400 K/uL    Comment: SPECIMEN CHECKED FOR CLOTS CONSISTENT WITH PREVIOUS RESULT   Comprehensive metabolic panel     Status: Abnormal   Collection Time: 04/08/15  2:15 AM  Result Value Ref Range   Sodium 138 135 - 145 mmol/L   Potassium 3.8 3.5 - 5.1 mmol/L   Chloride 107 101 - 111 mmol/L   CO2 27 22 - 32 mmol/L   Glucose, Bld 100 (H) 65 - 99 mg/dL   BUN 21 (H) 6 - 20 mg/dL   Creatinine, Ser 0.97 0.61 - 1.24 mg/dL   Calcium 7.9 (L) 8.9 - 10.3 mg/dL   Total Protein 6.3 (L) 6.5 - 8.1 g/dL   Albumin 2.1 (L) 3.5 - 5.0 g/dL   AST 611 (H) 15 - 41 U/L   ALT 318 (H) 17 - 63 U/L   Alkaline Phosphatase 113 38 - 126 U/L   Total Bilirubin 4.7 (H) 0.3 - 1.2 mg/dL   GFR calc non Af Amer >60 >60 mL/min   GFR calc Af Amer >60 >60 mL/min    Comment: (NOTE) The eGFR has been calculated using the CKD EPI equation. This calculation has not been validated in all clinical situations. eGFR's persistently <60 mL/min signify possible Chronic Kidney Disease.    Anion gap 4 (L) 5 - 15  CBC     Status: Abnormal   Collection Time: 04/08/15  2:15 AM  Result Value Ref Range   WBC 11.0 (H) 4.0 - 10.5 K/uL   RBC 2.70 (L) 4.22 - 5.81 MIL/uL   Hemoglobin 9.4 (L) 13.0 - 17.0 g/dL   HCT 27.9 (L) 39.0 - 52.0 %   MCV 103.3 (H) 78.0 - 100.0 fL   MCH 34.8 (H) 26.0 - 34.0 pg   MCHC 33.7 30.0 - 36.0 g/dL   RDW 16.9 (H) 11.5 - 15.5 %   Platelets 111 (L) 150 - 400  K/uL    Comment: SPECIMEN CHECKED FOR CLOTS CONSISTENT WITH PREVIOUS RESULT   CBC     Status: Abnormal   Collection Time: 04/08/15  8:38 AM  Result Value Ref Range   WBC 9.5 4.0 - 10.5 K/uL   RBC 2.52 (L) 4.22 - 5.81 MIL/uL   Hemoglobin 8.9 (L) 13.0 - 17.0 g/dL   HCT 26.4 (L) 39.0 - 52.0 %   MCV 104.8 (H) 78.0 - 100.0 fL   MCH 35.3 (H) 26.0 - 34.0 pg   MCHC 33.7 30.0 - 36.0 g/dL   RDW 17.0 (H) 11.5 - 15.5 %   Platelets 106 (L) 150 - 400 K/uL    Comment: SPECIMEN CHECKED FOR CLOTS PLATELET COUNT CONFIRMED BY SMEAR     Dg Abd Acute W/chest  04/07/2015  CLINICAL DATA:  Hematuria, vomiting blood, fever EXAM: DG ABDOMEN ACUTE W/ 1V CHEST COMPARISON:  None. FINDINGS: No active infiltrate or  effusion is seen. Mediastinal and hilar contours are unremarkable. The heart is borderline enlarged. Supine and erect views of the abdomen show no bowel obstruction. No free air is seen on the best erect view possible. Calcifications are present within the right upper quadrant which could represent calcified gallstones, not appearing to overlay the right renal shadow. There are degenerative changes in the lower lumbar spine. IMPRESSION: 1. No bowel obstruction.  No free air on the best films possible. 2. Question gallstones in the right upper quadrant. 3. No active lung disease. Electronically Signed   By: Ivar Drape M.D.   On: 04/07/2015 10:36   US Abdomen Limited Ruq  04/07/2015  CLINICAL DATA:  50 year old male the history of mid abdominal pain and rectal bleeding. EXAM: US ABDOMEN LIMITED - RIGHT UPPER QUADRANT COMPARISON:  Plain film 04/07/2015 FINDINGS: Gallbladder: Multiple echogenic foci within the lumen of the gallbladder. Gallbladder wall measures approximately 4 mm -5 mm. No pericholecystic fluid. Sonographic Murphy's sign negative. Common bile duct: Diameter: 5 mm Liver: Heterogeneous echotexture of the liver. IMPRESSION: Sonographic survey demonstrates cholelithiasis without sonographic evidence  of acute cholecystitis. Liver steatosis. Signed, Dulcy Fanny. Earleen Newport, DO [facility] Electronically Signed   By: Corrie Mckusick D.O.   On: 04/07/2015 11:05    ROS: See chart Blood pressure 122/58, pulse 88, temperature 98.2 F (36.8 C), temperature source Oral, resp. rate 20, height 5' 9"  (1.753 m), weight 151.2 kg (333 lb 5.4 oz), SpO2 100 %. Physical Exam: Pleasant black male in no acute distress. Abdomen soft, nontender, nondistended. No hepatosplenomegaly, masses, hernias identified  Assessment/Plan: Impression: Cholecystitis, cholelithiasis, possible passage of common bile duct stone Anemia, recent Mallory-Weiss tear, gastritis Plan: Patient will need laparoscopic cholecystectomy with possible cholangiogram in the future. May advance his diet as tolerated. The earliest that I would recommend him undergoing surgery is Monday. He may need blood transfusion prior to that so that his hemoglobin is around 10. Will follow with you. Will monitor liver enzyme tests.  Dyanne Yorks A 04/08/2015, 1:37 PM

## 2015-04-08 NOTE — Care Management Note (Signed)
Case Management Note  Patient Details  Name: Jesus [name] MRN: [id number] Date of Birth: Mar 31, 1965  Subjective/Objective:                  Pt is from home, lives with his brother. Pt is ind with ADL's. 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 Pt is uninsured, per his daughter his disability will go into affect in April 2017. Pt has no PCP as [place] recently [place] from [place]. Pt's daugher, at bedside, would like for [facility] to [facility]. They will see him on a sliding scale basis. Pt is agreeable to this plan, daughter will make f/u appointment.   Action/Plan: Pt plans to have lap chole on Monday. Pt will also f/u with surgeon. FC has been referred to see pt. May need MATCH voucher if new meds prescribed. Will cont to follow.   Expected Discharge Date:     04/12/2015             Expected Discharge Plan:  Home/Self Care  In-House Referral:  NA  Discharge planning Services  CM Consult, MATCH Program, Indigent [facility] Clinic  Post Acute Care Choice:  NA Choice offered to:  NA  DME Arranged:    DME Agency:     HH Arranged:    HH Agency:     Status of Service:  In process, will continue to follow  Medicare Important Message Given:    Date Medicare IM Given:    Medicare IM give by:    Date Additional Medicare IM Given:    Additional Medicare Important Message give by:     If discussed at Long Length of Stay Meetings, dates discussed:    Additional Comments:  [name], [name], RN 04/08/2015, 3:35 PM

## 2015-04-08 NOTE — Progress Notes (Signed)
Pt left the floor with nursing staff.  Belongings transported along with patient.  Report called to Val.  Vitals signs as follows:  Temp: 98.6 F (37 C) (03/16 1607) Temp Source: Oral (03/16 1607) BP: 140/63 mmHg (03/16 1607) Pulse Rate: 92 (03/16 1607) Respirations: 20

## 2015-04-08 NOTE — Progress Notes (Signed)
PROGRESS NOTE  Jesus [name] ZOX:[id number] DOB: August 15, 1965 DOA: 04/07/2015 PCP: No primary care provider on file.  Summary: 50 year old man with no known past medical history has not been seen by a primary care physician in some time who presents with acute onset of hematemesis. Initial laboratory studies revealed mixed elevation of LFTs, anemia and thrombocytopenia. Admitted for upper GI bleed, possible choledocholithiasis.  Assessment/Plan: 1. Upper GI bleed, secondary to MW tear. S/p EGD 3/15. 2. Multiple gastric ulcers 3. ABLA secondary to UGIB. Hgb stable 4. Elevated LFTs: mixed pattern, transaminases, AP and total bilirubin. Suspected choledocholithiasis. Right upper quadrant ultrasound showed cholelithiasis without cholecystitis. Acute abdominal series unremarkable. Lipase normal. LFTs slightly improved.  5. Macrocytic anemia, thrombocytopenia. Baseline unknown.Suspect related to underlying liver disease/fatty liver. Patient denies alcohol use.   Overall improving, continue PPI and management per gastroenterology.  Follow up general surgery recommendations.   Repeat upper endoscopy in 3 months to check healing.  Code Status: FULL DVT prophylaxis: SCDs Family Communication: Family bedside.  Disposition Plan: Anticipate discharge in 1-2 days.   Jesus Sacks, MD  [facility]  Pager [phone] If 7PM-7AM, please contact night-coverage at [email or web address], password [facility] 04/08/2015, 6:47 AM    Consultants:  GI   Procedures:  esophagogastroduodenoscopy 3/15 Impression: - linear esophageal ulceracross GE junction to his   lesser curvature consistent with Mallory-Weiss tear.  - Normal upper third of esophagus and middle third   of esophagus.  - Three small antral ulcers and10 mm prepyloric   ulcer noted without stigmata of  bleed.  - No specimens collected.  Antibiotics:  Rocephin 3/15>>  HPI/Subjective: Feels better denies any pain or vomiting. Was able to tolerate liquids.   Objective: Filed Vitals:   04/07/15 2000 04/08/15 0000 04/08/15 0400 04/08/15 0500  BP:      Pulse:      Temp: 97.7 F (36.5 C) 98.5 F (36.9 C) 98.4 F (36.9 C)   TempSrc: Oral Oral Oral   Resp:      Height:      Weight:    151.2 kg (333 lb 5.4 oz)  SpO2:        Intake/Output Summary (Last 24 hours) at 04/08/15 0647 Last data filed at 04/08/15 0500  Gross per 24 hour  Intake   1310 ml  Output    975 ml  Net    335 ml     Filed Weights   04/07/15 0733 04/08/15 0500  Weight: 149.687 kg (330 lb) 151.2 kg (333 lb 5.4 oz)    Exam:  Afebrile, VSS, no hypoxia  General:  Appears calm and comfortable Eyes: PERRL, normal lids, irises   Cardiovascular: RRR, no m/r/g. No LE edema. Telemetry: SR, no arrhythmias  Respiratory: CTA bilaterally, no w/r/r. Normal respiratory effort. Musculoskeletal: grossly normal tone BUE/BLE Psychiatric: grossly normal mood and affect, speech fluent and appropriate  New data reviewed:  WBC, improving. 11.0  LFT slightly decreased.   Hgb stable.8.9   Platelets 106  Pertinent data since admission:  None  Pending data:  None  Scheduled Meds: . cefTRIAXone (ROCEPHIN)  IV  1 g Intravenous Q24H  . Influenza vac split quadrivalent PF  0.5 mL Intramuscular Tomorrow-1000  . pantoprazole (PROTONIX) IV  40 mg Intravenous Q12H  . sodium chloride flush  3 mL Intravenous Q12H  . sucralfate  1 g Oral TID WC & HS   Continuous Infusions: . sodium chloride      Principal Problem:   UGI bleed Active Problems:   Hematemesis  Elevated LFTs   Thrombocytopenia (HCC)   Macrocytic anemia   Time spent 20 minutes     By signing my name below, I, Jesus [name] attest that this documentation has been prepared under the direction and in the presence of  Jesus Sacksaniel Goodrich, MD Electronically signed: [name]  04/08/2015 9:41am   I personally performed the services described in this documentation. All medical record entries made by the scribe were at my direction. I have reviewed the chart and agree that the record reflects my personal performance and is accurate and complete. Jesus Sacksaniel Goodrich, MD

## 2015-04-08 NOTE — Progress Notes (Signed)
Discussed Advance Directives materials. Will follow up.  

## 2015-04-09 LAB — COMPREHENSIVE METABOLIC PANEL
ALT: 311 U/L — ABNORMAL HIGH (ref 17–63)
ANION GAP: 4 — AB (ref 5–15)
AST: 586 U/L — ABNORMAL HIGH (ref 15–41)
Albumin: 2.1 g/dL — ABNORMAL LOW (ref 3.5–5.0)
Alkaline Phosphatase: 111 U/L (ref 38–126)
BILIRUBIN TOTAL: 4.4 mg/dL — AB (ref 0.3–1.2)
BUN: 14 mg/dL (ref 6–20)
CO2: 28 mmol/L (ref 22–32)
Calcium: 8.1 mg/dL — ABNORMAL LOW (ref 8.9–10.3)
Chloride: 106 mmol/L (ref 101–111)
Creatinine, Ser: 0.9 mg/dL (ref 0.61–1.24)
Glucose, Bld: 97 mg/dL (ref 65–99)
POTASSIUM: 4.2 mmol/L (ref 3.5–5.1)
Sodium: 138 mmol/L (ref 135–145)
TOTAL PROTEIN: 6.1 g/dL — AB (ref 6.5–8.1)

## 2015-04-09 LAB — CBC
HEMATOCRIT: 26.3 % — AB (ref 39.0–52.0)
Hemoglobin: 8.7 g/dL — ABNORMAL LOW (ref 13.0–17.0)
MCH: 34.5 pg — ABNORMAL HIGH (ref 26.0–34.0)
MCHC: 33.1 g/dL (ref 30.0–36.0)
MCV: 104.4 fL — AB (ref 78.0–100.0)
Platelets: 96 10*3/uL — ABNORMAL LOW (ref 150–400)
RBC: 2.52 MIL/uL — ABNORMAL LOW (ref 4.22–5.81)
RDW: 16.9 % — AB (ref 11.5–15.5)
WBC: 8.2 10*3/uL (ref 4.0–10.5)

## 2015-04-09 LAB — PROTIME-INR
INR: 1.38 (ref 0.00–1.49)
PROTHROMBIN TIME: 17.1 s — AB (ref 11.6–15.2)

## 2015-04-09 MED ORDER — PANTOPRAZOLE SODIUM 40 MG PO TBEC
40.0000 mg | DELAYED_RELEASE_TABLET | Freq: Two times a day (BID) | ORAL | Status: DC
  Administered 2015-04-09 – 2015-04-14 (×9): 40 mg via ORAL
  Filled 2015-04-09 (×11): qty 1

## 2015-04-09 MED ORDER — TRAZODONE HCL 50 MG PO TABS
50.0000 mg | ORAL_TABLET | Freq: Every evening | ORAL | Status: DC | PRN
  Administered 2015-04-09 – 2015-04-13 (×4): 50 mg via ORAL
  Filled 2015-04-09 (×4): qty 1

## 2015-04-09 NOTE — Progress Notes (Signed)
Patient is symptomatically improved. Liver enzyme tests are slowly improving. Does have thrombocytopenia. Still with anemia. Will recheck labs in a.m. Advance to low-fat diet. Still anticipate taking the gallbladder out on Monday.

## 2015-04-09 NOTE — Progress Notes (Signed)
  Subjective:  Patient continues to feel better. He did experience epigastric pain after he ate potato soup at lunchtime. He had bowel movement last night in stool was still black. He has not had BM today. He denies heartburn nausea or vomiting. He has been walking in the bathroom without getting lightheaded or dizzy. Patient denies history of jaundice hepatitis or IV drug use in the past.  Objective: Blood pressure 115/66, pulse 92, temperature 97.9 F (36.6 C), temperature source Oral, resp. rate 20, height 5\' 9"  (1.753 m), weight 333 lb 5.4 oz (151.2 kg), SpO2 99 %. Patient is alert and in no acute distress. Conjunctiva is pale and sclerae mildly icteric. Abdomen is full but soft and nontender without organomegaly or masses. No LE edema or clubbing noted.  Labs/studies Results:   Recent Labs  04/08/15 0215 04/08/15 0838 04/09/15 0610  WBC 11.0* 9.5 8.2  HGB 9.4* 8.9* 8.7*  HCT 27.9* 26.4* 26.3*  PLT 111* 106* 96*    BMET   Recent Labs  04/07/15 0755 04/07/15 0846 04/08/15 0215 04/09/15 0610  NA 138 143 138 138  K 4.3 4.4 3.8 4.2  CL 105 103 107 106  CO2 28  --  27 28  GLUCOSE 130* 121* 100* 97  BUN 25* 27* 21* 14  CREATININE 0.95 0.90 0.97 0.90  CALCIUM 8.2*  --  7.9* 8.1*    LFT   Recent Labs  04/07/15 0755 04/07/15 1600 04/08/15 0215 04/09/15 0610  PROT 7.5  --  6.3* 6.1*  ALBUMIN 2.4*  --  2.1* 2.1*  AST 749*  --  611* 586*  ALT 406*  --  318* 311*  ALKPHOS 142*  --  113 111  BILITOT 6.0* 4.9* 4.7* 4.4*  BILIDIR  --  2.6*  --   --   IBILI  --  2.3*  --   --     PT/INR   Recent Labs  04/07/15 0755 04/09/15 0610  LABPROT 16.9* 17.1*  INR 1.36 1.38    Hepatitis Panel   Recent Labs  04/07/15 0755  HEPBSAG Positive*  HCVAB 0.2  HEPAIGM Negative  HEPBIGM Negative     Assessment:  #1.Upper GI bleed most likely secondary to Mallory-Weiss tear and not due to peptic ulcer disease. He did not need therapeutic intervention. #2. Multiple  benign-appearing gastric ulcers. H. pylori serology is positive and he would be treated at a later date. #3. Anemia secondary to upper GI bleed. #4. Thrombocytopenia most likely secondary to chronic liver disease. I thought he may have fatty liver disease but hepatitis B surface antigen is positive. Please see did not have esophageal or gastric varices.  #5. Cholelithiasis.   Recommendations:  Will recheck INR with a.m. Lab. HBV DNA by PCR quantitative. AFP. Change pantoprazole to oral route.   Dr. Darrick PennaFields will be seen patient over the weekend.

## 2015-04-09 NOTE — Progress Notes (Signed)
PROGRESS NOTE  Jesus [name] ZOX:[id number] DOB: Jun 13, 1965 DOA: 04/07/2015 PCP: No primary care provider on file.  Summary: 50 year old man with no known past medical history has not been seen by a primary care physician in some time who presents with acute onset of hematemesis. Initial laboratory studies revealed mixed elevation of LFTs, anemia and thrombocytopenia. Admitted for upper GI bleed, possible choledocholithiasis.  Assessment/Plan: 1. Upper GI bleed, secondary to MW tear. S/p EGD 3/15. 2. Multiple gastric ulcers, benign appearing. H pylori serology positive, plan for treatment at a later date. 3. ABLA secondary to UGIB. Hgb stable 8.7 4. Elevated LFTs: mixed pattern, transaminases, AP and total bilirubin. Improving. Suspected choledocholithiasis, possible cholecystitis. Right upper quadrant ultrasound showed cholelithiasis without cholecystitis. Plan for laparoscopic cholecystecomy with possible cholangiogram. 5. Macrocytic anemia, thrombocytopenia. Baseline unknown. Suspect related to underlying liver disease/fatty liver, hepatitis B. Patient denies alcohol use. 6. Hepatitis B surface antigen positive. Quantitative PCR DNA pending.   Overall improving, continue PPI and management per gastroenterology.  Plan for cholecystectomy 3/20  Repeat upper endoscopy in 3 months to check healing.  Code Status: FULL DVT prophylaxis: SCDs Family Communication: Discussed with daughter bedside. Disposition Plan: Home when improved.  Brendia Sacks, MD  [facility]  Pager [phone] If 7PM-7AM, please contact night-coverage at [email or web address], password [facility] 04/09/2015, 7:45 AM  LOS: 2 days   Consultants:  GI   General surgery  Procedures:  esophagogastroduodenoscopy 3/15 Impression: - linear esophageal ulceracross GE junction to his   lesser curvature consistent with Mallory-Weiss tear.  - Normal upper third of  esophagus and middle third   of esophagus.  - Three small antral ulcers and10 mm prepyloric   ulcer noted without stigmata of bleed.  - No specimens collected.  Antibiotics:  Rocephin 3/15>>  HPI/Subjective: Feeling better. Some nausea but no vomiting. Overall tolerating diet. No bleeding. No abdominal pain.  Objective: Filed Vitals:   04/08/15 1607 04/08/15 1744 04/08/15 2158 04/09/15 0459  BP: 140/63 117/62 117/53 115/66  Pulse: 92 105 115 92  Temp: 98.6 F (37 C) 98.8 F (37.1 C) 98.3 F (36.8 C) 97.9 F (36.6 C)  TempSrc: Oral Oral Oral Oral  Resp: Height:      Weight:      SpO2: 100% 100% 100% 99%    Intake/Output Summary (Last 24 hours) at 04/09/15 0745 Last data filed at 04/08/15 1253  Gross per 24 hour  Intake    480 ml  Output      0 ml  Net    480 ml     Filed Weights   04/07/15 0733 04/08/15 0500  Weight: 149.687 kg (330 lb) 151.2 kg (333 lb 5.4 oz)    Exam: General:  Appears calm and comfortable Cardiovascular: RRR, no m/r/g.  Respiratory: CTA bilaterally, no w/r/r. Normal respiratory effort. Abdomen: soft, ntnd Psychiatric: grossly normal mood and affect, speech fluent and appropriate  New data reviewed:  WBC, wnl 8.2.  LFT slightly improving.  Hgb stable.8.7   Platelets 96  Scheduled Meds: . cefTRIAXone (ROCEPHIN)  IV  1 g Intravenous Q24H  . pantoprazole (PROTONIX) IV  40 mg Intravenous Q12H  . sodium chloride flush  3 mL Intravenous Q12H  . sucralfate  1 g Oral TID WC & HS   Continuous Infusions:    Principal Problem:   UGI bleed Active Problems:   Hematemesis   Elevated LFTs   Thrombocytopenia (HCC)   Macrocytic anemia   Time spent 20 minutes  By signing my name below, I, Adron BeneGreylon Gawaluck, attest that this documentation has been prepared under the direction and in the presence of Daniel P. Irene LimboGoodrich,  MD. Electronically Signed: Adron BeneGreylon Gawaluck, Scribe.  04/09/2015  I personally performed the services described in this documentation. All medical record entries made by the scribe were at my direction. I have reviewed the chart and agree that the record reflects my personal performance and is accurate and complete. Brendia Sacksaniel Goodrich, MD

## 2015-04-10 DIAGNOSIS — K922 Gastrointestinal hemorrhage, unspecified: Secondary | ICD-10-CM

## 2015-04-10 LAB — CBC
HEMATOCRIT: 25.5 % — AB (ref 39.0–52.0)
HEMOGLOBIN: 8.5 g/dL — AB (ref 13.0–17.0)
MCH: 34.4 pg — AB (ref 26.0–34.0)
MCHC: 33.3 g/dL (ref 30.0–36.0)
MCV: 103.2 fL — ABNORMAL HIGH (ref 78.0–100.0)
Platelets: 98 10*3/uL — ABNORMAL LOW (ref 150–400)
RBC: 2.47 MIL/uL — AB (ref 4.22–5.81)
RDW: 16.8 % — ABNORMAL HIGH (ref 11.5–15.5)
WBC: 6.8 10*3/uL (ref 4.0–10.5)

## 2015-04-10 LAB — HEPATIC FUNCTION PANEL
ALT: 300 U/L — AB (ref 17–63)
AST: 568 U/L — AB (ref 15–41)
Albumin: 2.1 g/dL — ABNORMAL LOW (ref 3.5–5.0)
Alkaline Phosphatase: 123 U/L (ref 38–126)
BILIRUBIN DIRECT: 1.8 mg/dL — AB (ref 0.1–0.5)
BILIRUBIN TOTAL: 3.7 mg/dL — AB (ref 0.3–1.2)
Indirect Bilirubin: 1.9 mg/dL — ABNORMAL HIGH (ref 0.3–0.9)
Total Protein: 6.2 g/dL — ABNORMAL LOW (ref 6.5–8.1)

## 2015-04-10 LAB — PROTIME-INR
INR: 1.38 (ref 0.00–1.49)
Prothrombin Time: 17.1 seconds — ABNORMAL HIGH (ref 11.6–15.2)

## 2015-04-10 MED ORDER — SODIUM CHLORIDE 0.9 % IV SOLN
Freq: Once | INTRAVENOUS | Status: AC
  Administered 2015-04-10: 11:00:00 via INTRAVENOUS

## 2015-04-10 NOTE — Progress Notes (Signed)
3 Days Post-Op  Subjective: Denies any abdominal pain.  Objective: Vital signs in last 24 hours: Temp:  [97.6 F (36.4 C)-98.6 F (37 C)] 98.5 F (36.9 C) (03/18 0625) Pulse Rate:  [79-102] 79 (03/18 0625) Resp:  [15-20] 15 (03/18 0625) BP: (95-118)/(43-57) 95/43 mmHg (03/18 0625) SpO2:  [98 %-100 %] 98 % (03/18 0625) Last BM Date: 04/08/15  Intake/Output from previous day: 03/17 0701 - 03/18 0700 In: 720 [P.O.:720] Out: -  Intake/Output this shift: Total I/O In: 240 [P.O.:240] Out: -   General appearance: alert, cooperative and no distress GI: soft, non-tender; bowel sounds normal; no masses,  no organomegaly  Lab Results:   Recent Labs  04/09/15 0610 04/10/15 0530  WBC 8.2 6.8  HGB 8.7* 8.5*  HCT 26.3* 25.5*  PLT 96* 98*   BMET  Recent Labs  04/08/15 0215 04/09/15 0610  NA 138 138  K 3.8 4.2  CL 107 106  CO2 27 28  GLUCOSE 100* 97  BUN 21* 14  CREATININE 0.97 0.90  CALCIUM 7.9* 8.1*   PT/INR  Recent Labs  04/09/15 0610 04/10/15 0530  LABPROT 17.1* 17.1*  INR 1.38 1.38    Studies/Results: No results found.  Anti-infectives: Anti-infectives    Start     Dose/Rate Route Frequency Ordered Stop   04/07/15 1515  cefTRIAXone (ROCEPHIN) 1 g in dextrose 5 % 50 mL IVPB     1 g 100 mL/hr over 30 Minutes Intravenous Every 24 hours 04/07/15 1508        Assessment/Plan: s/p Procedure(s): ESOPHAGOGASTRODUODENOSCOPY (EGD) Impression: Cholecystitis, cholelithiasis, anemia, thrombocytopenia, transaminitis Plan: We'll repeat labs in a.m. May need blood transfusion prior to surgery. Plan on laparoscopic cholecystectomy with intraoperative cholangiograms on 04/12/2015.  LOS: 3 days    Jesus [name] A 04/10/2015

## 2015-04-10 NOTE — Progress Notes (Signed)
Patient ID: Jesus [name], male   DOB: 1965/04/26, 50 y.o.   MRN: [id number]   Assessment/Plan: ADMITTED WITH VOMITING WITH SUBSEQUENT UGI BLEED/MW TEAR. CLINICALLY IMPROVED. AWAITING REMOVAL OF GB. LAST SEXUAL CONTACT: ~1 MO AGO. LIKELY RECOVERING FROM ACUTE HEP B. LIVER ENZYMES IMPROVING.  PLAN: 1. REVIEWED RESULTS/LABS AT BEDSIDE WITH PT. 2. CONTINUE WEIGHT LOSS EFFORTS TO PREVENT CIRRHOSIS.  GREATER THAN 50% WAS SPENT IN COUNSELING & COORDINATION OF CARE WITH THE PATIENT: DISCUSSED DIFFERENTIAL DIAGNOSIS, PROCEDURE, AND MANAGEMENT OF HEPATITIS, B, PUD, AND  NASH CIRRHOSIS. TOTAL ENCOUNTER TIME: 15 MINS.  Subjective: Since I last evaluated the patient HIS ABDOMINAL PAIN HAS IMPROVED. NO BRBPR OR MELENA.   Objective: Vital signs in last 24 hours: Filed Vitals:   04/09/15 2135 04/10/15 0625  BP: 104/57 95/43  Pulse: 102 79  Temp: 98.6 F (37 C) 98.5 F (36.9 C)  Resp: 20 15   General appearance: alert, cooperative and no distress Resp: clear to auscultation bilaterally Cardio: regular rate and rhythm GI: soft, non-tender; bowel sounds normaL  Lab Results:  HEP Bs aG POS CORE IgM aB NEG   Studies/Results: No results found.  Medications: I have reviewed the patient's current medications.   LOS: 5 days   Jonette EvaSandi Eliott Amparan 07/03/2013, 2:23 PM

## 2015-04-10 NOTE — Progress Notes (Signed)
PROGRESS NOTE  Jesus [name] AOZ:[id number] DOB: [birth year] DOA: 04/07/2015 PCP: No primary care provider on file.  Summary: 50 year old man with no known past medical history has not been seen by a primary care physician in some time who presents with acute onset of hematemesis. Initial laboratory studies revealed mixed elevation of LFTs, anemia and thrombocytopenia. Admitted for upper GI bleed, possible choledocholithiasis.  Assessment/Plan: 1. Upper GI bleed, secondary to MW tear. S/p EGD 3/15.No recurrent bleeding.  2. ABLA secondary to UGIB. Hgb stable 8.5 3. Multiple gastric ulcers, benign appearing. H pylori serology positive, plan for treatment at a later date. 4. Elevated LFTs: mixed pattern, transaminases, AP and total bilirubin. Continues to improve. Suspected choledocholithiasis, possible cholecystitis. 5. Macrocytic anemia, thrombocytopenia. Baseline unknown. Suspect related to underlying liver disease/fatty liver, hepatitis B. Patient denies alcohol use. 6. Hepatitis B surface antigen positive. Quantitative PCR DNA pending.   Overall doing well.   Plan for cholecystectomy 3/20  Code Status: FULL DVT prophylaxis: SCDs Family Communication: Discussed with daughter bedside. Disposition Plan: Home when improved.  Brendia Sacks, MD  [facility]  Pager [phone] If 7PM-7AM, please contact night-coverage at [email or web address], password [facility] 04/10/2015, 7:24 AM  LOS: 3 days   Consultants:  GI   General surgery  Procedures:  esophagogastroduodenoscopy 3/15 Impression: - linear esophageal ulceracross GE junction to his   lesser curvature consistent with Mallory-Weiss tear.  - Normal upper third of esophagus and middle third   of esophagus.  - Three small antral ulcers and10 mm prepyloric   ulcer noted without stigmata of  bleed.  - No specimens collected.  Antibiotics:  Rocephin 3/15>>  HPI/Subjective: Continues to feel better. Reports low blood in urine. No other bleeding. No nausea or vomiting.  Objective: Filed Vitals:   04/09/15 0459 04/09/15 1452 04/09/15 2135 04/10/15 0625  BP: 115/66 118/57 104/57 95/43  Pulse: 92 80 102 79  Temp: 97.9 F (36.6 C) 97.6 F (36.4 C) 98.6 F (37 C) 98.5 F (36.9 C)  TempSrc: Oral Oral Oral Oral  Resp: Height:      Weight:      SpO2: 99% 100% 99% 98%    Intake/Output Summary (Last 24 hours) at 04/10/15 0724 Last data filed at 04/09/15 1848  Gross per 24 hour  Intake    720 ml  Output      0 ml  Net    720 ml     Filed Weights   04/07/15 0733 04/08/15 0500  Weight: 149.687 kg (330 lb) 151.2 kg (333 lb 5.4 oz)    Exam: General:  Appears calm and comfortableSitting in chair. Cardiovascular: RRR, no m/r/g. No LE edema. Respiratory: CTA bilaterally, no w/r/r. Normal respiratory effort. Psychiatric: grossly normal mood and affect, speech fluent and appropriate  New data reviewed:  Hemoglobin stable 8.5. Platelets stable 98.  LFTs continues to trend down.   Scheduled Meds: . cefTRIAXone (ROCEPHIN)  IV  1 g Intravenous Q24H  . pantoprazole  40 mg Oral BID AC  . sodium chloride flush  3 mL Intravenous Q12H  . sucralfate  1 g Oral TID WC & HS   Continuous Infusions:    Principal Problem:   UGI bleed Active Problems:   Hematemesis   Elevated LFTs   Thrombocytopenia (HCC)   Macrocytic anemia   Time spent 20 minutes  By signing my name below, I, Zadie Cleverly attest that this documentation has been prepared under the direction and in the presence of Brendia Sacks,  MD Electronically signed: Zadie CleverlyJessica Augustus  04/10/2015 12:55pm  I personally performed the services described in this documentation. All medical record entries made by the scribe were at my direction. I have reviewed the chart and  agree that the record reflects my personal performance and is accurate and complete. Brendia Sacksaniel Goodrich, MD

## 2015-04-11 LAB — CBC
HEMATOCRIT: 26.7 % — AB (ref 39.0–52.0)
HEMOGLOBIN: 8.8 g/dL — AB (ref 13.0–17.0)
MCH: 34.4 pg — AB (ref 26.0–34.0)
MCHC: 33 g/dL (ref 30.0–36.0)
MCV: 104.3 fL — ABNORMAL HIGH (ref 78.0–100.0)
Platelets: 90 10*3/uL — ABNORMAL LOW (ref 150–400)
RBC: 2.56 MIL/uL — ABNORMAL LOW (ref 4.22–5.81)
RDW: 17.3 % — ABNORMAL HIGH (ref 11.5–15.5)
WBC: 6.1 10*3/uL (ref 4.0–10.5)

## 2015-04-11 LAB — HEPATIC FUNCTION PANEL
ALBUMIN: 3.4 g/dL — AB (ref 3.5–5.0)
ALK PHOS: 109 U/L (ref 38–126)
ALT: 223 U/L — ABNORMAL HIGH (ref 17–63)
AST: 259 U/L — ABNORMAL HIGH (ref 15–41)
BILIRUBIN TOTAL: 0.4 mg/dL (ref 0.3–1.2)
Bilirubin, Direct: 0.1 mg/dL (ref 0.1–0.5)
Indirect Bilirubin: 0.3 mg/dL (ref 0.3–0.9)
Total Protein: 5.7 g/dL — ABNORMAL LOW (ref 6.5–8.1)

## 2015-04-11 LAB — CBC WITH DIFFERENTIAL/PLATELET
Basophils Absolute: 0 10*3/uL (ref 0.0–0.1)
Basophils Relative: 0 %
EOS ABS: 0.2 10*3/uL (ref 0.0–0.7)
EOS PCT: 2 %
HCT: 30.5 % — ABNORMAL LOW (ref 39.0–52.0)
Hemoglobin: 10.5 g/dL — ABNORMAL LOW (ref 13.0–17.0)
LYMPHS ABS: 2.6 10*3/uL (ref 0.7–4.0)
Lymphocytes Relative: 34 %
MCH: 34.9 pg — AB (ref 26.0–34.0)
MCHC: 34.4 g/dL (ref 30.0–36.0)
MCV: 101.3 fL — ABNORMAL HIGH (ref 78.0–100.0)
MONOS PCT: 11 %
Monocytes Absolute: 0.8 10*3/uL (ref 0.1–1.0)
Neutro Abs: 4 10*3/uL (ref 1.7–7.7)
Neutrophils Relative %: 53 %
PLATELETS: 91 10*3/uL — AB (ref 150–400)
RBC: 3.01 MIL/uL — AB (ref 4.22–5.81)
RDW: 18.6 % — AB (ref 11.5–15.5)
WBC: 7.6 10*3/uL (ref 4.0–10.5)

## 2015-04-11 LAB — SURGICAL PCR SCREEN
MRSA, PCR: NEGATIVE
Staphylococcus aureus: NEGATIVE

## 2015-04-11 LAB — PREPARE RBC (CROSSMATCH)

## 2015-04-11 LAB — BASIC METABOLIC PANEL
ANION GAP: 4 — AB (ref 5–15)
BUN: 11 mg/dL (ref 6–20)
CO2: 28 mmol/L (ref 22–32)
Calcium: 8.2 mg/dL — ABNORMAL LOW (ref 8.9–10.3)
Chloride: 106 mmol/L (ref 101–111)
Creatinine, Ser: 0.89 mg/dL (ref 0.61–1.24)
GFR calc Af Amer: >60 mL/min (ref >60)
GLUCOSE: 88 mg/dL (ref 65–99)
POTASSIUM: 4.1 mmol/L (ref 3.5–5.1)
Sodium: 138 mmol/L (ref 135–145)

## 2015-04-11 LAB — HEPATITIS B E ANTIGEN: Hep B E Ag: POSITIVE — AB

## 2015-04-11 LAB — AFP TUMOR MARKER: AFP-Tumor Marker: 35 ng/mL — ABNORMAL HIGH (ref 0.0–8.3)

## 2015-04-11 MED ORDER — CHLORHEXIDINE GLUCONATE 4 % EX LIQD
1.0000 "application " | Freq: Once | CUTANEOUS | Status: AC
  Administered 2015-04-12: 1 via TOPICAL
  Filled 2015-04-11: qty 30

## 2015-04-11 MED ORDER — SODIUM CHLORIDE 0.9 % IV SOLN
Freq: Once | INTRAVENOUS | Status: AC
  Administered 2015-04-11: 08:00:00 via INTRAVENOUS

## 2015-04-11 NOTE — Progress Notes (Signed)
PROGRESS NOTE  Jesus [name] [name] ZOX:[id number] DOB: March 16, 1965 DOA: 04/07/2015 PCP: No primary care provider on file.  Summary: 50 year old man with no known past medical history has not been seen by a primary care physician in some time who presents with acute onset of hematemesis. Initial laboratory studies revealed mixed elevation of LFTs, anemia and thrombocytopenia. Admitted for upper GI bleed, possible choledocholithiasis.  Assessment/Plan: 1. Upper GI bleed, secondary to MW tear. S/p EGD 3/15. No recurrent bleeding.  2. ABLA secondary to UGIB. Stable. 3. Multiple gastric ulcers, benign appearing. H pylori serology positive, plan for treatment at a later date. 4. Elevated LFTs: mixed pattern, transaminases, AP and total bilirubin. Continue to improve. Suspected choledocholithiasis, possible cholecystiti versus acute hepatitis B s. Appreciate General Surgery recommendations. Plan for cholecystectomy in am. 5. Macrocytic anemia, thrombocytopenia. Remain stable. Baseline unknown. Suspect related to underlying liver disease/fatty liver, hepatitis B. Patient denies alcohol use. 6. Hepatitis B surface antigen positive. Quantitative PCR DNA pending.   Plan for cholecystectomy in the am. General surgery transfusion today.  Anticipate home 1-2 days.  Code Status: FULL DVT prophylaxis: SCDs Family Communication: Family bedside. Disposition Plan: Anticipate home tomorrow after surgery.  Jesus Sacksaniel Stryder Poitra, MD  [facility]  Pager [phone] If 7PM-7AM, please contact night-coverage at [email or web address], password [email or web address] 04/11/2015, 6:45 AM  LOS: 4 days   Consultants:  GI   General surgery  Procedures:  esophagogastroduodenoscopy 3/15 Impression: - linear esophageal ulceracross GE junction to his   lesser curvature consistent with Mallory-Weiss tear.  - Normal upper third of esophagus and middle third    of esophagus.  - Three small antral ulcers and10 mm prepyloric   ulcer noted without stigmata of bleed.  - No specimens collected.  Transfused 2 units PRBC 3/19  Antibiotics:  Rocephin 3/15>>  HPI/Subjective: Feeling good. No reports of chest pain, shortness of breath, n/v/d, or abd pain.  Objective: Filed Vitals:   04/10/15 1104 04/10/15 1419 04/10/15 2157 04/11/15 0525  BP:  112/96 123/57 122/58  Pulse:  80 87 78  Temp:  97.5 F (36.4 C) 98.3 F (36.8 C) 98.4 F (36.9 C)  TempSrc:  Oral Oral Oral  Resp:  20 20 20   Height:      Weight:      SpO2: 98% 100% 100% 98%    Intake/Output Summary (Last 24 hours) at 04/11/15 0645 Last data filed at 04/11/15 0525  Gross per 24 hour  Intake    720 ml  Output    825 ml  Net   -105 ml     Filed Weights   04/07/15 0733 04/08/15 0500  Weight: 149.687 kg (330 lb) 151.2 kg (333 lb 5.4 oz)    Exam: Afebrile, not hypoxic General:  Appears calm and comfortable Cardiovascular: RRR, no m/r/g.   Respiratory: CTA bilaterally, no w/r/r. Normal respiratory effort. Psychiatric: grossly normal mood and affect, speech fluent and appropriate  New data reviewed:  Basic metabolic panel unremarkable  AST, ALT trending down. Total bilirubin is normalized.  Hemoglobin stable 8.8. Platelet count stable 90.  Scheduled Meds: . cefTRIAXone (ROCEPHIN)  IV  1 g Intravenous Q24H  . pantoprazole  40 mg Oral BID AC  . sodium chloride flush  3 mL Intravenous Q12H  . sucralfate  1 g Oral TID WC & HS   Continuous Infusions:    Principal Problem:   UGI bleed Active Problems:   Hematemesis   Elevated LFTs   Thrombocytopenia (HCC)   Macrocytic anemia   Time spent 15  minutes  By signing my name below, I, Jesus [name] attest that this documentation has been prepared under the direction and in the presence of Jesus Sacks,  MD Electronically signed: Zadie [name]  04/11/2015 12:06pm  I personally performed the services described in this documentation. All medical record entries made by the scribe were at my direction. I have reviewed the chart and agree that the record reflects my personal performance and is accurate and complete. Jesus Sacks, MD

## 2015-04-11 NOTE — Progress Notes (Signed)
Scheduled for laparoscopic cholecystectomy with cholangiograms, liver biopsy tomorrow. The risks and benefits of the procedure including bleeding, infection, cardiopulmonary difficulties, hepatobiliary injury, and the possibility of an open procedure were fully explained to the patient, who gave informed consent. Will receive 2 units of packed red blood cells today.

## 2015-04-12 ENCOUNTER — Encounter (HOSPITAL_COMMUNITY): Payer: Self-pay | Admitting: *Deleted

## 2015-04-12 ENCOUNTER — Inpatient Hospital Stay (HOSPITAL_COMMUNITY): Payer: MEDICAID | Admitting: Anesthesiology

## 2015-04-12 ENCOUNTER — Encounter (HOSPITAL_COMMUNITY)
Admission: EM | Disposition: A | Discharge status: Home / Self Care | Payer: Self-pay | Source: Home / Self Care | Attending: Family Medicine

## 2015-04-12 HISTORY — PX: CHOLECYSTECTOMY: SHX55

## 2015-04-12 LAB — HEPATITIS B E ANTIBODY: Hep B E Ab: NEGATIVE

## 2015-04-12 SURGERY — LAPAROSCOPIC CHOLECYSTECTOMY
Anesthesia: General

## 2015-04-12 MED ORDER — HEMOSTATIC AGENTS (NO CHARGE) OPTIME
TOPICAL | Status: DC | PRN
  Administered 2015-04-12: 1 via TOPICAL

## 2015-04-12 MED ORDER — MIDAZOLAM HCL 2 MG/2ML IJ SOLN
1.0000 mg | INTRAMUSCULAR | Status: DC | PRN
  Administered 2015-04-12 (×2): 2 mg via INTRAVENOUS

## 2015-04-12 MED ORDER — LACTATED RINGERS IV SOLN
INTRAVENOUS | Status: DC
  Administered 2015-04-12 (×2): via INTRAVENOUS

## 2015-04-12 MED ORDER — BUPIVACAINE HCL (PF) 0.5 % IJ SOLN
INTRAMUSCULAR | Status: AC
Start: 2015-04-12 — End: 2015-04-12
  Filled 2015-04-12: qty 30

## 2015-04-12 MED ORDER — LACTATED RINGERS IV SOLN
INTRAVENOUS | Status: DC
  Administered 2015-04-12 – 2015-04-14 (×4): via INTRAVENOUS

## 2015-04-12 MED ORDER — ROCURONIUM BROMIDE 100 MG/10ML IV SOLN
INTRAVENOUS | Status: DC | PRN
  Administered 2015-04-12: 10 mg via INTRAVENOUS
  Administered 2015-04-12: 15 mg via INTRAVENOUS
  Administered 2015-04-12: 5 mg via INTRAVENOUS

## 2015-04-12 MED ORDER — FENTANYL CITRATE (PF) 100 MCG/2ML IJ SOLN
INTRAMUSCULAR | Status: AC
  Filled 2015-04-12: qty 6

## 2015-04-12 MED ORDER — ONDANSETRON 4 MG PO TBDP: 4.0000 mg | ORAL_TABLET | Freq: Four times a day (QID) | ORAL | Status: DC | PRN

## 2015-04-12 MED ORDER — HYDROMORPHONE HCL 1 MG/ML IJ SOLN: 1.0000 mg | INTRAMUSCULAR | Status: DC | PRN

## 2015-04-12 MED ORDER — MIDAZOLAM HCL 5 MG/5ML IJ SOLN
INTRAMUSCULAR | Status: DC | PRN
  Administered 2015-04-12: 2 mg via INTRAVENOUS

## 2015-04-12 MED ORDER — BUPIVACAINE HCL 0.5 % IJ SOLN
INTRAMUSCULAR | Status: DC | PRN
  Administered 2015-04-12: 10 mL

## 2015-04-12 MED ORDER — FENTANYL CITRATE (PF) 100 MCG/2ML IJ SOLN
INTRAMUSCULAR | Status: DC | PRN
  Administered 2015-04-12 (×3): 50 ug via INTRAVENOUS
  Administered 2015-04-12: 100 ug via INTRAVENOUS
  Administered 2015-04-12: 50 ug via INTRAVENOUS

## 2015-04-12 MED ORDER — POVIDONE-IODINE 10 % EX OINT
TOPICAL_OINTMENT | CUTANEOUS | Status: AC
Start: 2015-04-12 — End: 2015-04-12
  Filled 2015-04-12: qty 1

## 2015-04-12 MED ORDER — LIDOCAINE HCL (PF) 1 % IJ SOLN
INTRAMUSCULAR | Status: AC
  Filled 2015-04-12: qty 10

## 2015-04-12 MED ORDER — GLYCOPYRROLATE 0.2 MG/ML IJ SOLN
INTRAMUSCULAR | Status: AC
  Filled 2015-04-12: qty 2

## 2015-04-12 MED ORDER — MIDAZOLAM HCL 2 MG/2ML IJ SOLN
INTRAMUSCULAR | Status: AC
  Filled 2015-04-12: qty 2

## 2015-04-12 MED ORDER — DEXAMETHASONE SODIUM PHOSPHATE 4 MG/ML IJ SOLN
INTRAMUSCULAR | Status: AC
  Filled 2015-04-12: qty 1

## 2015-04-12 MED ORDER — NEOSTIGMINE METHYLSULFATE 10 MG/10ML IV SOLN
INTRAVENOUS | Status: DC | PRN
  Administered 2015-04-12 (×2): 2 mg via INTRAVENOUS

## 2015-04-12 MED ORDER — LIDOCAINE HCL 1 % IJ SOLN
INTRAMUSCULAR | Status: DC | PRN
  Administered 2015-04-12: 30 mg via INTRADERMAL

## 2015-04-12 MED ORDER — ONDANSETRON HCL 4 MG/2ML IJ SOLN: 4.0000 mg | Freq: Four times a day (QID) | INTRAMUSCULAR | Status: DC | PRN

## 2015-04-12 MED ORDER — ROCURONIUM BROMIDE 50 MG/5ML IV SOLN
INTRAVENOUS | Status: AC
  Filled 2015-04-12: qty 1

## 2015-04-12 MED ORDER — PROPOFOL 10 MG/ML IV BOLUS
INTRAVENOUS | Status: AC
  Filled 2015-04-12: qty 20

## 2015-04-12 MED ORDER — ONDANSETRON HCL 4 MG/2ML IJ SOLN
INTRAMUSCULAR | Status: AC
  Filled 2015-04-12: qty 2

## 2015-04-12 MED ORDER — ONDANSETRON HCL 4 MG/2ML IJ SOLN
4.0000 mg | Freq: Once | INTRAMUSCULAR | Status: DC | PRN
Start: 2015-04-12 — End: 2015-04-12

## 2015-04-12 MED ORDER — ONDANSETRON HCL 4 MG/2ML IJ SOLN
4.0000 mg | Freq: Once | INTRAMUSCULAR | Status: AC
  Administered 2015-04-12: 4 mg via INTRAVENOUS

## 2015-04-12 MED ORDER — OXYCODONE HCL 5 MG PO TABS: 5.0000 mg | ORAL_TABLET | ORAL | Status: DC | PRN

## 2015-04-12 MED ORDER — GLYCOPYRROLATE 0.2 MG/ML IJ SOLN
INTRAMUSCULAR | Status: DC | PRN
  Administered 2015-04-12: 0.6 mg via INTRAVENOUS

## 2015-04-12 MED ORDER — SEVOFLURANE IN SOLN
RESPIRATORY_TRACT | Status: AC
Start: 2015-04-12 — End: 2015-04-12
  Filled 2015-04-12: qty 250

## 2015-04-12 MED ORDER — SUCCINYLCHOLINE CHLORIDE 20 MG/ML IJ SOLN
INTRAMUSCULAR | Status: AC
  Filled 2015-04-12: qty 1

## 2015-04-12 MED ORDER — SUCCINYLCHOLINE CHLORIDE 20 MG/ML IJ SOLN
INTRAMUSCULAR | Status: DC | PRN
  Administered 2015-04-12: 140 mg via INTRAVENOUS

## 2015-04-12 MED ORDER — 0.9 % SODIUM CHLORIDE (POUR BTL) OPTIME
TOPICAL | Status: DC | PRN
  Administered 2015-04-12: 1000 mL

## 2015-04-12 MED ORDER — PROPOFOL 10 MG/ML IV BOLUS
INTRAVENOUS | Status: DC | PRN
  Administered 2015-04-12: 50 mg via INTRAVENOUS
  Administered 2015-04-12: 150 mg via INTRAVENOUS
  Administered 2015-04-12: 50 mg via INTRAVENOUS

## 2015-04-12 MED ORDER — FENTANYL CITRATE (PF) 100 MCG/2ML IJ SOLN
25.0000 ug | INTRAMUSCULAR | Status: DC | PRN
  Administered 2015-04-12 (×2): 50 ug via INTRAVENOUS
  Filled 2015-04-12: qty 2

## 2015-04-12 SURGICAL SUPPLY — 49 items
APPLIER CLIP LAPSCP 10X32 DD (CLIP) ×3 IMPLANT
BAG HAMPER (MISCELLANEOUS) ×3 IMPLANT
CHLORAPREP W/TINT 26ML (MISCELLANEOUS) ×3 IMPLANT
CLOTH BEACON ORANGE TIMEOUT ST (SAFETY) ×3 IMPLANT
COVER LIGHT HANDLE STERIS (MISCELLANEOUS) ×6 IMPLANT
COVER MAYO STAND XLG (DRAPE) ×3 IMPLANT
DECANTER SPIKE VIAL GLASS SM (MISCELLANEOUS) ×3 IMPLANT
DRAPE C-ARM FOLDED MOBILE STRL (DRAPES) ×3 IMPLANT
ELECT REM PT RETURN 9FT ADLT (ELECTROSURGICAL) ×3
ELECTRODE REM PT RTRN 9FT ADLT (ELECTROSURGICAL) ×1 IMPLANT
FILTER SMOKE EVAC LAPAROSHD (FILTER) ×3 IMPLANT
FORMALIN 10 PREFIL 120ML (MISCELLANEOUS) ×3 IMPLANT
GLOVE BIOGEL M 7.0 STRL (GLOVE) ×6 IMPLANT
GLOVE BIOGEL PI IND STRL 7.0 (GLOVE) ×2 IMPLANT
GLOVE BIOGEL PI IND STRL 7.5 (GLOVE) ×1 IMPLANT
GLOVE BIOGEL PI INDICATOR 7.0 (GLOVE) ×4
GLOVE BIOGEL PI INDICATOR 7.5 (GLOVE) ×2
GLOVE SURG SS PI 7.5 STRL IVOR (GLOVE) ×6 IMPLANT
GOWN STRL REUS W/ TWL LRG LVL3 (GOWN DISPOSABLE) ×2 IMPLANT
GOWN STRL REUS W/ TWL XL LVL3 (GOWN DISPOSABLE) ×1 IMPLANT
GOWN STRL REUS W/TWL LRG LVL3 (GOWN DISPOSABLE) ×13 IMPLANT
GOWN STRL REUS W/TWL XL LVL3 (GOWN DISPOSABLE) ×2
HEMOSTAT SNOW SURGICEL 2X4 (HEMOSTASIS) ×3 IMPLANT
INST SET LAPROSCOPIC AP (KITS) ×3 IMPLANT
KIT ROOM TURNOVER APOR (KITS) ×3 IMPLANT
MANIFOLD NEPTUNE II (INSTRUMENTS) ×3 IMPLANT
NEEDLE INSUFFLATION 120MM (ENDOMECHANICALS) ×3 IMPLANT
NS IRRIG 1000ML POUR BTL (IV SOLUTION) ×3 IMPLANT
PACK LAP CHOLE LZT030E (CUSTOM PROCEDURE TRAY) ×3 IMPLANT
PAD ARMBOARD 7.5X6 YLW CONV (MISCELLANEOUS) ×3 IMPLANT
PENCIL HANDSWITCHING (ELECTRODE) ×3 IMPLANT
POUCH SPECIMEN RETRIEVAL 10MM (ENDOMECHANICALS) ×3 IMPLANT
SET BASIN LINEN APH (SET/KITS/TRAYS/PACK) ×3 IMPLANT
SLEEVE ENDOPATH XCEL 5M (ENDOMECHANICALS) ×3 IMPLANT
SPONGE GAUZE 2X2 8PLY STER LF (GAUZE/BANDAGES/DRESSINGS) ×4
SPONGE GAUZE 2X2 8PLY STRL LF (GAUZE/BANDAGES/DRESSINGS) ×8 IMPLANT
SPONGE SURGIFOAM ABS GEL 100 (HEMOSTASIS) ×3 IMPLANT
STAPLER VISISTAT (STAPLE) ×3 IMPLANT
SUT VICRYL 0 UR6 27IN ABS (SUTURE) ×3 IMPLANT
SYR 20CC LL (SYRINGE) ×3 IMPLANT
SYR 30ML LL (SYRINGE) ×3 IMPLANT
SYR CONTROL 10ML LL (SYRINGE) ×3 IMPLANT
TAPE CLOTH SURG 4X10 WHT LF (GAUZE/BANDAGES/DRESSINGS) ×3 IMPLANT
TROCAR ENDO BLADELESS 11MM (ENDOMECHANICALS) ×3 IMPLANT
TROCAR XCEL NON-BLD 5MMX100MML (ENDOMECHANICALS) ×3 IMPLANT
TROCAR XCEL UNIV SLVE 11M 100M (ENDOMECHANICALS) ×3 IMPLANT
TUBING INSUFFLATION (TUBING) ×3 IMPLANT
WARMER LAPAROSCOPE (MISCELLANEOUS) ×3 IMPLANT
YANKAUER SUCT 12FT TUBE ARGYLE (SUCTIONS) ×3 IMPLANT

## 2015-04-12 NOTE — Anesthesia Preprocedure Evaluation (Signed)
Anesthesia Evaluation  Patient identified by MRN, date of birth, ID band Patient awake    Reviewed: Allergy & Precautions, NPO status , Patient's Chart, lab work & pertinent test results  Airway Mallampati: I  TM Distance: >3 FB     Dental  (+) Poor Dentition, Missing, Chipped, Dental Advisory Given,    Pulmonary neg pulmonary ROS,    breath sounds clear to auscultation       Cardiovascular negative cardio ROS   Rhythm:Regular Rate:Normal     Neuro/Psych    GI/Hepatic (+) Cirrhosis       , Hepatitis -, B  Endo/Other  Morbid obesity  Renal/GU      Musculoskeletal   Abdominal   Peds  Hematology   Anesthesia Other Findings   Reproductive/Obstetrics                             Anesthesia Physical Anesthesia Plan  ASA: III  Anesthesia Plan: General   Post-op Pain Management:    Induction: Intravenous, Rapid sequence and Cricoid pressure planned  Airway Management Planned: Oral ETT  Additional Equipment:   Intra-op Plan:   Post-operative Plan: Extubation in OR  Informed Consent: I have reviewed the patients History and Physical, chart, labs and discussed the procedure including the risks, benefits and alternatives for the proposed anesthesia with the patient or authorized representative who has indicated his/her understanding and acceptance.     Plan Discussed with:   Anesthesia Plan Comments:         Anesthesia Quick Evaluation

## 2015-04-12 NOTE — Transfer of Care (Signed)
Immediate Anesthesia Transfer of Care Note  Patient: Jesus [name]  Procedure(s) Performed: Procedure(s): LAPAROSCOPIC CHOLECYSTECTOMY (N/A)  Patient Location: PACU  Anesthesia Type:General  Level of Consciousness: awake and patient cooperative  Airway & Oxygen Therapy: Patient Spontanous Breathing and Patient connected to face mask oxygen  Post-op Assessment: Report given to RN, Post -op Vital signs reviewed and stable and Patient moving all extremities  Post vital signs: Reviewed and stable  Last Vitals:  Filed Vitals:   04/12/15 1100 04/12/15 1200  BP:  113/59  Pulse:    Temp:    Resp: 19 16    Complications: No apparent anesthesia complications

## 2015-04-12 NOTE — Op Note (Signed)
Patient:  Jesus [name]  DOB:  02/01/1965  MRN:  [id number]   Preop Diagnosis:  Cholecystitis, cholelithiasis, cirrhosis, hepatitis  Postop Diagnosis:  Same  Procedure:  Laparoscopic cholecystectomy  Surgeon:  Franky MachoMark Ahmani Prehn, M.D.  Anes:  Gen. endotracheal  Indications:  Patient is a 50 year old black male who presents with cholecystitis, cholelithiasis, transaminitis consistent with hepatitis B, and possible cirrhosis. He is about to undergo a laparoscopic cholecystectomy with possible cholangiograms. I was going to do a liver biopsy, the patient adamantly refuses. The risks and benefits of the procedure including bleeding, infection, hepatobiliary injury, the possibility of an open procedure were fully explained to the patient, who gave informed consent.  Procedure note:  The patient was placed the supine position. After induction of general endotracheal anesthesia, the abdomen was prepped and draped using the usual sterile technique with DuraPrep. Surgical site confirmation was performed.  A supraumbilical incision was made down to the fascia. A Veress needle was introduced into the abdominal cavity and confirmation of placement was done using the saline drop test. The abdomen was then insufflated to 16 mmHg pressure. An 11 mm trocar was introduced into the abdominal cavity under direct visualization without difficulty. The patient was placed in reverse Trendelenburg position and additional 11 mm trocar was placed the epigastric region 5 mm trochars were placed the right upper quadrant right flank regions. 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 The gallbladder was noted to be thin-walled and somewhat edematous, but no purulent fluid was present. The gallbladder was retracted in a dynamic fashion in order to provide a critical view of the triangle of Calot. The cystic duct was first identified. Its junction to the infundibulum flow identified. It was  significantly narrowed, thus precluding a cholangiogram. Clips were placed proximally distally on the cystic duct, the cystic duct was divided. This was likewise done to the cystic artery. The gallbladder was freed away from the gallbladder fossa using Bovie cautery. The gallbladder was delivered to the epigastric trocar site using an Endo Catch bag. The gallbladder fossa was inspected and no abnormal bleeding or bile leakage was noted. Surgicel was placed the gallbladder fossa. All fluid and air were then evacuated from the abdominal cavity prior to removal of the trochars.  All wounds were irrigated with normal saline. All wounds were injected with 0.5% Sensorcaine. All incisions were closed using staples. Betadine ointment and dry sterile dressings were applied.  All tape and needle counts were correct at the end of the procedure. Patient was extubated in the operating room and transferred to PACU in stable condition.  Complications:  None  EBL:  Minimal  Specimen:  Gallbladder

## 2015-04-12 NOTE — Progress Notes (Signed)
Checked in with patient to see if now wanted to complete his Advance Directives,which he previously said he wasn't interested in doing. He was in a procedure. Will attempt to check again.

## 2015-04-12 NOTE — Anesthesia Postprocedure Evaluation (Signed)
Anesthesia Post Note  Patient: Jesus [name]  Procedure(s) Performed: Procedure(s) (LRB): LAPAROSCOPIC CHOLECYSTECTOMY (N/A)  Patient location during evaluation: PACU Anesthesia Type: General Level of consciousness: awake and alert and patient cooperative Pain management: pain level controlled Vital Signs Assessment: post-procedure vital signs reviewed and stable Respiratory status: patient connected to face mask oxygen Cardiovascular status: blood pressure returned to baseline Postop Assessment: no signs of nausea or vomiting Anesthetic complications: no    Last Vitals:  Filed Vitals:   04/12/15 1200 04/12/15 1400  BP: 113/59 160/73  Pulse:  117  Temp:  36.4 C  Resp: 16 13    Last Pain:  Filed Vitals:   04/12/15 1410  PainSc: 0-No pain                 Lorrie Strauch J

## 2015-04-12 NOTE — Progress Notes (Addendum)
  Subjective:  Patient has no complaints. He states he is not convinced that he has cirrhosis.  Objective: Blood pressure 150/66, pulse 82, temperature 97.7 F (36.5 C), temperature source Oral, resp. rate 18, height 5\' 9"  (1.753 m), weight 333 lb (151.048 kg), SpO2 100 %. Patient is alert and does not have asterixis. Sclera is nonicteric. Abdomen is full with laparoscopy wounds. Trace edema around ankles.  Labs/studies Results:   Recent Labs  04/10/15 0530 04/11/15 0621 04/11/15 1849  WBC 6.8 6.1 7.6  HGB 8.5* 8.8* 10.5*  HCT 25.5* 26.7* 30.5*  PLT 98* 90* 91*    BMET   Recent Labs  04/11/15 0621  NA 138  K 4.1  CL 106  CO2 28  GLUCOSE 88  BUN 11  CREATININE 0.89  CALCIUM 8.2*    LFT   Recent Labs  04/10/15 0530 04/11/15 0621  PROT 6.2* 5.7*  ALBUMIN 2.1* 3.4*  AST 568* 259*  ALT 300* 223*  ALKPHOS 123 109  BILITOT 3.7* 0.4  BILIDIR 1.8* 0.1  IBILI 1.9* 0.3    PT/INR   Recent Labs  04/10/15 0530  LABPROT 17.1*  INR 1.38    Hepatitis Panel  Hepatitis B e antigen positive Hepatitis B e antibody negative HBV DNA Quant pending  AFP 35.  Assessment:  #1. 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 He does not have any other risk factors. Macronodular cirrhosis documented at the time of laparoscopy with biopsy not taken as patient refused to sign consent. Patient will be candidate for antiviral therapy as soon as he is recovered from cholecystectomy. Patient does not have insurance. Will request assistance from manufacturer. #2. Upper GI bleed secondary to Mallory-Weiss tear. Patient has stopped bleeding. He received 2 units of PRBCs yesterday in preparation for surgery today. #3. Gastric ulcers. H. pylori positive. Patient is on PPI. HBV therapy will be administered on an outpatient basis. #4. Possible choledocholithiasis. Suspect he is past the stones his bilirubin has returned to normal and significant decrease in transaminases. #5. Mildly  elevated AFP. This will be repeated in 3 months. Patient's body habitus would not allow for MRCP to be performed here.   Recommendations:  Repeat LFTs in a.m. to make sure that he has not developed decrease in hepatic function following anesthesia. Patient's daughter GrenadaBrittany advised her to be checked for hepatitis be as well as other family members. If they test negative didn't need to be vaccinated.

## 2015-04-12 NOTE — Progress Notes (Signed)
PROGRESS NOTE  Jesus [name]:[id number] DOB: 03/03/1965 DOA: 04/07/2015 PCP: No primary care provider on file.  Summary: [age] year old [name] with no known past medical history has not been seen by a primary care physician in some time who presents with acute onset of hematemesis. Initial laboratory studies revealed mixed elevation of LFTs, anemia and thrombocytopenia. Admitted for upper GI bleed, possible choledocholithiasis.  Assessment/Plan: 1. Upper GI bleed, secondary to MW tear. S/p EGD 3/15. No recurrent bleeding.  2. ABLA secondary to UGIB. Stable. Transfused 2 units 3/19 in preparation for surgery. 3. Multiple gastric ulcers, benign appearing. H pylori serology positive, plan for treatment at a later date. 4. Elevated LFTs: mixed pattern, transaminases, AP and total bilirubin.  Suspected choledocholithiasis, possible cholecystitis versus acute hepatitis B s. Appreciate General Surgery recommendations. Status post cholecystectomy 3/20. 5. Macrocytic anemia, thrombocytopenia. Baseline unknown. Suspect related to underlying liver disease/fatty liver, hepatitis B. Patient denies alcohol use. 6. Hepatitis B surface antigen positive, Hepatitis B E ab positive. Quantitative PCR DNA pending.   Overall stable. Likely discharge home next 48 hours when cleared by general surgery.  Code Status: FULL DVT prophylaxis: SCDs Family Communication: Family bedside. Disposition Plan: Anticipate home tomorrow after surgery.  Jesus Sacksaniel Goodrich, MD  [facility]  Pager [phone] If 7PM-7AM, please contact night-coverage at [email or web address], password [email or web address] 04/12/2015, 4:53 PM  LOS: 5 days   Consultants:  GI   General surgery  Procedures:  Cholecystectomy 3/20  esophagogastroduodenoscopy 3/15 Impression: - linear esophageal ulceracross GE junction to his   lesser curvature consistent with Mallory-Weiss tear.  - Normal upper third  of esophagus and middle third   of esophagus.  - Three small antral ulcers and10 mm prepyloric   ulcer noted without stigmata of bleed.  - No specimens collected.  Transfused 2 units PRBC 3/19  Antibiotics:  Rocephin 3/15>>  HPI/Subjective: Feels ok.  Objective: Filed Vitals:   04/12/15 1423 04/12/15 1430 04/12/15 1445 04/12/15 1504  BP:  147/100 166/76 150/66  Pulse: 102 105 81 82  Temp:   97.7 F (36.5 C) 97.7 F (36.5 C)  TempSrc:    Oral  Resp: 16 24 19 18   Height:      Weight:      SpO2: 98% 100% 99% 100%    Intake/Output Summary (Last 24 hours) at 04/12/15 1653 Last data filed at 04/12/15 1451  Gross per 24 hour  Intake   1440 ml  Output    350 ml  Net   1090 ml     Filed Weights   04/07/15 0733 04/08/15 0500 04/12/15 1059  Weight: 149.687 kg (330 lb) 151.2 kg (333 lb 5.4 oz) 151.048 kg (333 lb)    Exam: General:  Appears calm and comfortable Cardiovascular: RRR, no m/r/g.   Respiratory: CTA bilaterally, no w/r/r. Normal respiratory effort. Psychiatric: grossly normal mood and affect, speech fluent and appropriate  New data reviewed:  Hemoglobin 10.5 after transfusion last evening.  Scheduled Meds: . cefTRIAXone (ROCEPHIN)  IV  1 g Intravenous Q24H  . pantoprazole  40 mg Oral BID AC  . sodium chloride flush  3 mL Intravenous Q12H  . sucralfate  1 g Oral TID WC & HS   Continuous Infusions: . lactated ringers 100 mL/hr at 04/12/15 1507    Principal Problem:   UGI bleed Active Problems:   Hematemesis   Elevated LFTs   Thrombocytopenia (HCC)   Macrocytic anemia   Time spent 15 minutes  By signing my name below, I, Jesus CleverlyJessica Augustus attest  that this documentation has been prepared under the direction and in the presence of Jesus Sacks, MD Electronically signed: Zadie [name]  04/12/2015 12:06pm  I personally performed the services  described in this documentation. All medical record entries made by the scribe were at my direction. I have reviewed the chart and agree that the record reflects my personal performance and is accurate and complete. Jesus Sacks, MD

## 2015-04-12 NOTE — Anesthesia Procedure Notes (Signed)
Procedure Name: Intubation Date/Time: 04/12/2015 12:59 PM Performed by: Franco NonesYATES, Talon Witting S Pre-anesthesia Checklist: Patient identified, Patient being monitored, Timeout performed, Emergency Drugs available and Suction available Patient Re-evaluated:Patient Re-evaluated prior to inductionOxygen Delivery Method: Circle System Utilized Preoxygenation: Pre-oxygenation with 100% oxygen Intubation Type: IV induction, Rapid sequence and Cricoid Pressure applied Laryngoscope Size: Mac and 3 Grade View: Grade I Tube type: Oral Tube size: 8.0 mm Number of attempts: 1 Airway Equipment and Method: Stylet Placement Confirmation: ETT inserted through vocal cords under direct vision,  positive ETCO2 and breath sounds checked- equal and bilateral Secured at: 21 cm Tube secured with: Tape Dental Injury: Teeth and Oropharynx as per pre-operative assessment

## 2015-04-13 ENCOUNTER — Encounter (HOSPITAL_COMMUNITY): Payer: Self-pay | Admitting: General Surgery

## 2015-04-13 DIAGNOSIS — B169 Acute hepatitis B without delta-agent and without hepatic coma: Secondary | ICD-10-CM

## 2015-04-13 LAB — BASIC METABOLIC PANEL
ANION GAP: 4 — AB (ref 5–15)
BUN: 9 mg/dL (ref 6–20)
CALCIUM: 8.1 mg/dL — AB (ref 8.9–10.3)
CO2: 26 mmol/L (ref 22–32)
Chloride: 107 mmol/L (ref 101–111)
Creatinine, Ser: 0.78 mg/dL (ref 0.61–1.24)
GFR calc non Af Amer: >60 mL/min (ref >60)
Glucose, Bld: 84 mg/dL (ref 65–99)
Potassium: 4.7 mmol/L (ref 3.5–5.1)
Sodium: 137 mmol/L (ref 135–145)

## 2015-04-13 LAB — CBC
HEMATOCRIT: 30.6 % — AB (ref 39.0–52.0)
Hemoglobin: 10.6 g/dL — ABNORMAL LOW (ref 13.0–17.0)
MCH: 35.1 pg — ABNORMAL HIGH (ref 26.0–34.0)
MCHC: 34.6 g/dL (ref 30.0–36.0)
MCV: 101.3 fL — ABNORMAL HIGH (ref 78.0–100.0)
PLATELETS: 96 10*3/uL — AB (ref 150–400)
RBC: 3.02 MIL/uL — ABNORMAL LOW (ref 4.22–5.81)
RDW: 18.2 % — AB (ref 11.5–15.5)
WBC: 9.9 10*3/uL (ref 4.0–10.5)

## 2015-04-13 LAB — HEPATIC FUNCTION PANEL
ALBUMIN: 2.1 g/dL — AB (ref 3.5–5.0)
ALT: 267 U/L — ABNORMAL HIGH (ref 17–63)
AST: 500 U/L — AB (ref 15–41)
Alkaline Phosphatase: 126 U/L (ref 38–126)
Bilirubin, Direct: 2.1 mg/dL — ABNORMAL HIGH (ref 0.1–0.5)
Indirect Bilirubin: 2 mg/dL — ABNORMAL HIGH (ref 0.3–0.9)
TOTAL PROTEIN: 6.3 g/dL — AB (ref 6.5–8.1)
Total Bilirubin: 4.1 mg/dL — ABNORMAL HIGH (ref 0.3–1.2)

## 2015-04-13 LAB — PROTIME-INR
INR: 1.4 (ref 0.00–1.49)
Prothrombin Time: 17.2 seconds — ABNORMAL HIGH (ref 11.6–15.2)

## 2015-04-13 LAB — PHOSPHORUS: PHOSPHORUS: 3.3 mg/dL (ref 2.5–4.6)

## 2015-04-13 LAB — MAGNESIUM: MAGNESIUM: 1.8 mg/dL (ref 1.7–2.4)

## 2015-04-13 NOTE — Addendum Note (Signed)
Addendum  created 04/13/15 0931 by Despina Hiddenobert J Janesia Joswick, CRNA   Modules edited: Clinical Notes   Clinical Notes:  File: [id number]

## 2015-04-13 NOTE — Progress Notes (Signed)
LCSW discussed case with financial counselor due to patient's previous hx of mental health. [place] is known to [place] in mental health [place] and has no current psychiatric follow up.  Best plan for patient [facility] which he does not need an appointment and LCSW placed information on DC paperwork.  At appointment patient can be seen for medications/other services and would benefit from ACT services due to unreliable transportation and housing. Patient plans to DC with family but most [facility].   No behaviors noted this admission as patient has not been paranoid or psychotic per MD or nursing staff.  No TTS warranted at this time due to no behaviors affecting  Hospitalization.  Outpatient follow up is highly recommended and voiced to family.  Deretha EmoryHannah Norwin Aleman LCSW, MSW

## 2015-04-13 NOTE — Progress Notes (Addendum)
PROGRESS NOTE  Jesus [name] ZOX:[id number] DOB: 05-17-1965 DOA: 04/07/2015 PCP: No primary care provider on file.  Summary: 50 year old man with no known past medical history has not been seen by a primary care physician in some time who presents with acute onset of hematemesis. Initial laboratory studies revealed mixed elevation of LFTs, anemia and thrombocytopenia. Admitted for upper GI bleed, possible choledocholithiasis. He underwent endoscopy which revealed a Mallory-Weiss tear and has had no further bleeding. Multiple gastric ulcers seen, benign appearing. Noted to have elevated LFTs, hepatitis panel revealed acute hepatitis B. Consideration was given to the possibility of choledocholithiasis, however hepatitis B felt to be more likely the etiology. Underwent cholecystectomy 3/20. Subsequently had elevation of liver enzymes. Once he stabilizes anticipate discharge home.  Assessment/Plan: 1. Upper GI bleed, secondary to MW tear. S/p EGD 3/15. Stable. 2. ABLA secondary to UGIB. Remains stable. Transfused 2 units 3/19 in preparation for surgery. 3. Multiple gastric ulcers, benign appearing. H pylori serology positive, plan for treatment at a later date. 4. Elevated LFTs: mixed pattern, transaminases, AP and total bilirubin; recurrent. Thought to be secondary to hepatitis B, possibly related to anesthesia.  5. Cholelithiasis. Status post cholecystectomy 3/20. 6. Macrocytic anemia, thrombocytopenia. Baseline unknown. Suspect related to underlying liver disease/fatty liver, hepatitis B. Patient denies alcohol use. Stable. 7. Hepatitis B surface antigen positive, Hepatitis B E ag positive. Quantitative PCR DNA pending.   Overall stable. Recheck LFTs in the am.   Screen for HIV  Likely discharge home next 24 hours if hepatic function improved.  Code Status: FULL DVT prophylaxis: SCDs Family Communication: Family bedside. Disposition Plan: Anticipate home tomorrow   Jesus Sacks,  MD  [facility]  Pager [phone] If 7PM-7AM, please contact night-coverage at [email or web address], password [facility] 04/13/2015, 7:46 AM  LOS: 6 days   Consultants:  GI   General surgery  Procedures:  Cholecystectomy 3/20  esophagogastroduodenoscopy 3/15 Impression: - linear esophageal ulceracross GE junction to his   lesser curvature consistent with Mallory-Weiss tear.  - Normal upper third of esophagus and middle third   of esophagus.  - Three small antral ulcers and10 mm prepyloric   ulcer noted without stigmata of bleed.  - No specimens collected.  Transfused 2 units PRBC 3/19  Antibiotics:  Rocephin 3/15>>3/20  HPI/Subjective: Feeling ok. Mild sting from surgical site. No reports of chest pain, shortness of breath, n/v/d, or abd pain.  Objective: Filed Vitals:   04/12/15 1445 04/12/15 1504 04/12/15 2100 04/13/15 0605  BP: 166/76 150/66 138/66 104/58  Pulse: 81 82 92 82  Temp: 97.7 F (36.5 C) 97.7 F (36.5 C) 98.7 F (37.1 C) 98.7 F (37.1 C)  TempSrc:  Oral Oral Oral  Resp: Height:      Weight:      SpO2: 99% 100% 98% 97%    Intake/Output Summary (Last 24 hours) at 04/13/15 0746 Last data filed at 04/13/15 8295  Gross per 24 hour  Intake   1560 ml  Output   1300 ml  Net    260 ml     Filed Weights   04/07/15 0733 04/08/15 0500 04/12/15 1059  Weight: 149.687 kg (330 lb) 151.2 kg (333 lb 5.4 oz) 151.048 kg (333 lb)    Exam: General:  Appears calm and comfortable Cardiovascular: RRR, no m/r/g. No LE edema. Respiratory: CTA bilaterally, no w/r/r. Normal respiratory effort. Psychiatric: grossly normal mood and affect, speech fluent and appropriate  New data reviewed:  BMP unremarkable.   Mg and  Phosphorus wnl   Hgb stable, 10.6  Platelet stable, 96  LFTS have  substantially increased including total bilirubin, AST and ALT.  Scheduled Meds: . cefTRIAXone (ROCEPHIN)  IV  1 g Intravenous Q24H  . pantoprazole  40 mg Oral BID AC  . sodium chloride flush  3 mL Intravenous Q12H  . sucralfate  1 g Oral TID WC & HS   Continuous Infusions: . lactated ringers 100 mL/hr at 04/12/15 2142    Principal Problem:   UGI bleed Active Problems:   Hematemesis   Elevated LFTs   Thrombocytopenia (HCC)   Macrocytic anemia   Time spent 15 minutes  By signing my name below, I, Zadie CleverlyJessica Augustus attest that this documentation has been prepared under the direction and in the presence of Jesus Sacksaniel Briyana Badman, MD Electronically signed: Zadie CleverlyJessica Augustus  04/13/2015 12:20pm  I personally performed the services described in this documentation. All medical record entries made by the scribe were at my direction. I have reviewed the chart and agree that the record reflects my personal performance and is accurate and complete. Jesus Sacksaniel Yanixan Mellinger, MD

## 2015-04-13 NOTE — Care Management Note (Signed)
Case Management Note  Patient Details  Name: Ardyth ManRay Herdt MRN: [id number] Date of Birth: 02/17/1965   Expected Discharge Date:                  Expected Discharge Plan:  Home/Self Care  In-House Referral:  NA  Discharge planning Services  CM Consult, [facility] Program, Indigent Health Clinic  Post Acute Care Choice:  NA Choice offered to:  NA  DME Arranged:    DME Agency:     HH Arranged:    HH Agency:     Status of Service:  In process, will continue to follow  Medicare Important Message Given:    Date Medicare IM Given:    Medicare IM give by:    Date Additional Medicare IM Given:    Additional Medicare Important Message give by:     If discussed at Long Length of Stay Meetings, dates discussed:  04/13/2015  Additional Comments:  [name], Chaunda Vandergriff Demske, RN 04/13/2015, 4:30 PM

## 2015-04-13 NOTE — Progress Notes (Signed)
  Subjective:  Patient has no complaints other than mild discomfort below the right costal margin. He denies nausea vomiting or melena. He had formed stool yesterday.  Objective: Blood pressure 104/58, pulse 82, temperature 98.7 F (37.1 C), temperature source Oral, resp. rate 20, height 5\' 9"  (1.753 m), weight 333 lb (151.048 kg), SpO2 97 %. Patient is alert and does not have asterixis. Sclera does not appear to be icteric. Abdomen is full but soft with mild tenderness over laparoscopy wound sites. Organomegaly or masses. Trace edema noted around ankles.  Labs/studies Results:   Recent Labs  04/11/15 0621 04/11/15 1849 04/13/15 0535  WBC 6.1 7.6 9.9  HGB 8.8* 10.5* 10.6*  HCT 26.7* 30.5* 30.6*  PLT 90* 91* 96*    BMET   Recent Labs  04/11/15 0621 04/13/15 0535  NA 138 137  K 4.1 4.7  CL 106 107  CO2 28 26  GLUCOSE 88 84  BUN 11 9  CREATININE 0.89 0.78  CALCIUM 8.2* 8.1*    LFT   Recent Labs  04/11/15 0621 04/13/15 0535  PROT 5.7* 6.3*  ALBUMIN 3.4* 2.1*  AST 259* 500*  ALT 223* 267*  ALKPHOS 109 126  BILITOT 0.4 4.1*  BILIDIR 0.1 2.1*  IBILI 0.3 2.0*    PT/INR   Recent Labs  04/13/15 0535  LABPROT 17.2*  INR 1.40    HBV DNA pending INR 1.40  Assessment:  Patient is status post laparoscopic cholecystectomy yesterday. 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 Other known risk factors include obesity. Patient's bilirubin and transaminases have increased significantly since surgery possibly related to anesthesia. Patient is not encephalopathic. He needs to be monitored in the hospital for another 24 hours to make sure hepatic dysfunction is not progressive.  Upper GI bleed inactive. EGD revealed Mallory-Weiss tear and multiple gastric ulcers and he also has H. pylori gastritis which would be treated at a later date.  Anemia multifactorial, primarily secondary to upper GI bleed. Patient received 2 units of PRBCs prior to surgery and H&H  remained stable.   Recommendations:  Repeat LFTs in a.m.

## 2015-04-13 NOTE — Anesthesia Postprocedure Evaluation (Signed)
Anesthesia Post Note  Patient: Jesus [name]  Procedure(s) Performed: Procedure(s) (LRB): LAPAROSCOPIC CHOLECYSTECTOMY (N/A)  Patient location during evaluation: Nursing Unit Anesthesia Type: General Level of consciousness: awake and alert, oriented and patient cooperative Pain management: pain level controlled Vital Signs Assessment: post-procedure vital signs reviewed and stable Respiratory status: spontaneous breathing and nonlabored ventilation Cardiovascular status: blood pressure returned to baseline and stable Postop Assessment: no signs of nausea or vomiting and adequate PO intake Anesthetic complications: no    Last Vitals:  Filed Vitals:   04/12/15 2100 04/13/15 0605  BP: 138/66 104/58  Pulse: 92 82  Temp: 37.1 C 37.1 C  Resp: 20 20    Last Pain:  Filed Vitals:   04/13/15 0915  PainSc: 0-No pain                 Tava Peery J

## 2015-04-13 NOTE — Progress Notes (Signed)
1 Day Post-Op  Subjective: Denies any significant abdominal pain. Tolerating diet well.  Objective: Vital signs in last 24 hours: Temp:  [97.6 F (36.4 C)-98.7 F (37.1 C)] 98.7 F (37.1 C) (03/21 0605) Pulse Rate:  [56-117] 82 (03/21 0605) Resp:  [13-24] 20 (03/21 0605) BP: (104-166)/(58-100) 104/58 mmHg (03/21 0605) SpO2:  [97 %-100 %] 97 % (03/21 0605) Weight:  [151.048 kg (333 lb)] 151.048 kg (333 lb) (03/20 1059) Last BM Date: 04/11/15  Intake/Output from previous day: 03/20 0701 - 03/21 0700 In: 1560 [P.O.:360; I.V.:1200] Out: 1300 [Urine:1275; Blood:25] Intake/Output this shift:    General appearance: alert, cooperative and no distress GI: Soft, dressings dry and intact.  Lab Results:   Recent Labs  04/11/15 1849 04/13/15 0535  WBC 7.6 9.9  HGB 10.5* 10.6*  HCT 30.5* 30.6*  PLT 91* 96*   BMET  Recent Labs  04/11/15 0621 04/13/15 0535  NA 138 137  K 4.1 4.7  CL 106 107  CO2 28 26  GLUCOSE 88 84  BUN 11 9  CREATININE 0.89 0.78  CALCIUM 8.2* 8.1*   PT/INR  Recent Labs  04/13/15 0535  LABPROT 17.2*  INR 1.40    Studies/Results: No results found.  Anti-infectives: Anti-infectives    Start     Dose/Rate Route Frequency Ordered Stop   04/07/15 1515  cefTRIAXone (ROCEPHIN) 1 g in dextrose 5 % 50 mL IVPB  Status:  Discontinued     1 g 100 mL/hr over 30 Minutes Intravenous Every 24 hours 04/07/15 1508 04/13/15 0828      Assessment/Plan: s/p Procedure(s): LAPAROSCOPIC CHOLECYSTECTOMY Impression: Stable postoperatively. Liver enzyme tests are abnormal and most likely secondary to acute hepatitis B. Surgical complication less likely given patient's physical findings. Discussed with Dr.Rehman.  Anticipate discharge in next 24-48 hours. Will stop Rocephin.  LOS: 6 days    Jesus [name] A 04/13/2015

## 2015-04-14 DIAGNOSIS — D696 Thrombocytopenia, unspecified: Secondary | ICD-10-CM

## 2015-04-14 DIAGNOSIS — R7989 Other specified abnormal findings of blood chemistry: Secondary | ICD-10-CM

## 2015-04-14 LAB — CBC
HCT: 31.2 % — ABNORMAL LOW (ref 39.0–52.0)
HEMOGLOBIN: 10.6 g/dL — AB (ref 13.0–17.0)
MCH: 34.3 pg — AB (ref 26.0–34.0)
MCHC: 34 g/dL (ref 30.0–36.0)
MCV: 101 fL — ABNORMAL HIGH (ref 78.0–100.0)
PLATELETS: 89 10*3/uL — AB (ref 150–400)
RBC: 3.09 MIL/uL — AB (ref 4.22–5.81)
RDW: 17.6 % — ABNORMAL HIGH (ref 11.5–15.5)
WBC: 9.6 10*3/uL (ref 4.0–10.5)

## 2015-04-14 LAB — HEPATIC FUNCTION PANEL
ALK PHOS: 132 U/L — AB (ref 38–126)
ALT: 254 U/L — AB (ref 17–63)
AST: 467 U/L — AB (ref 15–41)
Albumin: 2.2 g/dL — ABNORMAL LOW (ref 3.5–5.0)
BILIRUBIN INDIRECT: 2 mg/dL — AB (ref 0.3–0.9)
Bilirubin, Direct: 2.2 mg/dL — ABNORMAL HIGH (ref 0.1–0.5)
TOTAL PROTEIN: 6.7 g/dL (ref 6.5–8.1)
Total Bilirubin: 4.2 mg/dL — ABNORMAL HIGH (ref 0.3–1.2)

## 2015-04-14 MED ORDER — PANTOPRAZOLE SODIUM 40 MG PO TBEC: 40.0000 mg | DELAYED_RELEASE_TABLET | Freq: Two times a day (BID) | ORAL | Status: DC

## 2015-04-14 MED ORDER — SUCRALFATE 1 GM/10ML PO SUSP: 1.0000 g | Freq: Three times a day (TID) | ORAL | Status: DC

## 2015-04-14 MED ORDER — OXYCODONE HCL 5 MG PO TABS: 5.0000 mg | ORAL_TABLET | ORAL | Status: DC | PRN

## 2015-04-14 NOTE — Progress Notes (Signed)
2 Days Post-Op  Subjective: Patient feels fine. Has no abdominal pain. Tolerating diet well.  Objective: Vital signs in last 24 hours: Temp:  [98.5 F (36.9 C)-99.3 F (37.4 C)] 99.1 F (37.3 C) (03/22 0647) Pulse Rate:  [84-103] 103 (03/22 0647) Resp:  [20] 20 (03/22 0647) BP: (124-128)/(53-55) 128/53 mmHg (03/22 0647) SpO2:  [98 %-100 %] 98 % (03/22 0647) Last BM Date: 04/13/15  Intake/Output from previous day: 03/21 0701 - 03/22 0700 In: 3090 [P.O.:240; I.V.:2850] Out: 1950 [Urine:1950] Intake/Output this shift: Total I/O In: 240 [P.O.:240] Out: -   General appearance: alert, cooperative and no distress GI: Soft, dressings dry and intact.  Lab Results:   Recent Labs  04/13/15 0535 04/13/15 2350  WBC 9.9 9.6  HGB 10.6* 10.6*  HCT 30.6* 31.2*  PLT 96* 89*   BMET  Recent Labs  04/13/15 0535  NA 137  K 4.7  CL 107  CO2 26  GLUCOSE 84  BUN 9  CREATININE 0.78  CALCIUM 8.1*   PT/INR  Recent Labs  04/13/15 0535  LABPROT 17.2*  INR 1.40    Studies/Results: No results found.  Anti-infectives: Anti-infectives    Start     Dose/Rate Route Frequency Ordered Stop   04/07/15 1515  cefTRIAXone (ROCEPHIN) 1 g in dextrose 5 % 50 mL IVPB  Status:  Discontinued     1 g 100 mL/hr over 30 Minutes Intravenous Every 24 hours 04/07/15 1508 04/13/15 0828      Assessment/Plan: s/p Procedure(s): LAPAROSCOPIC CHOLECYSTECTOMY Impression: Stable from surgery standpoint for discharge. Liver abnormalities secondary to acute hepatitis B. Will follow-up in my office in 2 weeks for staple removal. Will sign off.  LOS: 7 days    Ivorie Uplinger A 04/14/2015

## 2015-04-14 NOTE — Progress Notes (Addendum)
Patient ID: Jesus [name], male   DOB: 26-Jun-1965, 50 y.o.   MRN: [id number] Patient states he is ready to go home. Had discussion about his Hepatitis B and his increased risk for liver cancer. Blood pressure 128/53, pulse 103, temperature 99.1 F (37.3 C), temperature source Oral, resp. rate 20, height 5\' 9"  (1.753 m), weight 333 lb (151.048 kg), SpO2 98 %. He denies any pain at this time.  Hepatitis B: Dr. Karilyn Cotaehman will try to get his medication for him. Hep B quaint is still pending. He is H. Pylori positive. Will try to get samples of this for him. I have asked Tammy to see if drug rep. Can bring samples by the office.    GI attending note:  As above patient is doing very well. All of his questions answered. She will advised to watch salt intake and check his weight once a week. Patient advised not to take aspirin or other OTC NSAIDs. Can take Tylenol on as-needed basis for headache or muscle skeletal pain. If pantoprazole prescription is not cost effective he will take Prilosec OTC 20 mg by mouth twice a day. We will plan to see patient as soon as we can obtain medication for hep B.

## 2015-04-14 NOTE — Progress Notes (Signed)
Discharged PT per MD order and protocol. Discharge handouts reviewed/explained. Education completed. Prescriptions given and explained. Pt verbalized understanding and left with all belongings. VSS. IV catheter D/C.  Patient wheeled down by staff member. Cali Hope J Everett, RN  

## 2015-04-14 NOTE — Discharge Instructions (Signed)
Laparoscopic Cholecystectomy, Care After These instructions give you information about caring for yourself after your procedure. Your doctor may also give you more specific instructions. Call your doctor if you have any problems or questions after your procedure. HOME CARE Incision Care  Follow instructions from your doctor about how to take care of your cuts from surgery (incisions). Make sure you:  Wash your hands with soap and water before you change your bandage (dressing). If you cannot use soap and water, use hand sanitizer.  Change your bandage as told by your doctor.  Leave stitches (sutures), skin glue, or skin tape (adhesive) strips in place. They may need to stay in place for 2 weeks or longer. If tape strips get loose and curl up, you may trim the loose edges. Do not remove tape strips completely unless your doctor says it is okay.  Do not take baths, swim, or use a hot tub until your doctor says it is okay. Ask your doctor if you can take showers. You may only be allowed to take sponge baths. General Instructions  Take over-the-counter and prescription medicines only as told by your doctor.  Do not drive or use heavy machinery while taking prescription pain medicine.  Return to your normal diet as told by your doctor.  Do not lift anything that is heavier than 10 lb (4.5 kg).  Do not play contact sports for 1 week or until your doctor says it is okay. GET HELP IF:  You have redness, swelling, or pain at the site of your surgical cuts.  You have fluid, blood, or pus coming from your cuts.  You notice a bad smell coming from your cut area.  Your surgical cuts break open.  You have a fever.   GET HELP RIGHT AWAY IF:   You have a rash.  You have trouble breathing.  You have chest pain.  You have pain in your shoulders (shoulder strap areas) that is getting worse.  You pass out (faint) or feel dizzy while you are standing.  You have very bad pain in your  belly (abdomen).  You feel sick to your stomach (nauseous) or throw up (vomit) for more than 1 day.   This information is not intended to replace advice given to you by your health care provider. Make sure you discuss any questions you have with your health care provider.   Document Released: 10/19/2007 Document Revised: 09/30/2014 Document Reviewed: 08/21/2012 Elsevier Interactive Patient Education 2016 Else[employer] Inc[employer]  DO NOT TAKE:Goody Powders or Ibuprofen due to increasing the chances of bleeding  WEIGH YOURSELF DAILY.   Monitor food intake and exercise 3x week if not more  Take medications appropriately.   Continue to follow up with appointments

## 2015-04-14 NOTE — Care Management Note (Signed)
Case Management Note  Patient Details  Name: Jesus [name] MRN: [id number] Date of Birth: Jan 18, 1966   Expected Discharge Date:     04/14/2015             Expected Discharge Plan:  Home/Self Care  In-House Referral:  NA  Discharge planning Services  CM Consult, MATCH Program, [facility], Medication Assistance  Post Acute Care Choice:  NA Choice offered to:  NA  DME Arranged:    DME Agency:     HH Arranged:    HH Agency:     Status of Service:  Completed, signed off  Medicare Important Message Given:    Date Medicare IM Given:    Medicare IM give by:    Date Additional Medicare IM Given:    Additional Medicare Important Message give by:     If discussed at Long Length of Stay Meetings, dates discussed:    Additional Comments: Pt discharging home today with self care. F/[facility] appointment [facility] with [facility] de[facility] at pt's request. Pt also will f/u with GI and surgery. Pt given MATCH voucher and [facility]. Pt has no further CM needs.   Malcolm Metrohildress, Siara Gorder Demske, RN 04/14/2015, 2:28 PM

## 2015-04-14 NOTE — Progress Notes (Signed)
Patient ID: Jesus [name], male   DOB: 10-Jan-1966, 50 y.o.   MRN: [id number] Please have patient come by Dr. Araceli Boucheehman/Terri Setzer's office (across from hospital) and pick up the Pylera for his H. Pylori.

## 2015-04-14 NOTE — Discharge Summary (Signed)
Physician Discharge Summary  Jesus [name] WGN:[id number] DOB: [birth year] DOA: 04/07/2015  PCP: No primary care provider on file.  Admit date: 04/07/2015 Discharge date: 04/14/2015  Time spent: 45 minutes  Recommendations for Outpatient Follow-up:  -Will be discharged home today. -Will f/u with surgery and GI as scheduled.   Discharge Diagnoses:  Principal Problem:   UGI bleed Active Problems:   Hematemesis   Elevated LFTs   Thrombocytopenia (HCC)   Macrocytic anemia   Discharge Condition: Stable and improved  Filed Weights   04/07/15 0733 04/08/15 0500 04/12/15 1059  Weight: 149.687 kg (330 lb) 151.2 kg (333 lb 5.4 oz) 151.048 kg (333 lb)    History of present illness:  As per Dr. Irene LimboGoodrich on 3/15: 50 year old man with no known past medical history has not been seen by a primary care physician in some time who presents with acute onset of hematemesis. Initial laboratory studies revealed mixed elevation of LFTs, anemia and thrombocytopenia. Admitted for upper GI bleed, possible choledocholithiasis.  Patient has felt well until this morning at 2 AM he woke up vomiting blood. He had one additional episode in the emergency department. No nausea. No abdominal pain. No pain with eating. No previous history of ulcer or gastrointestinal problems. No blood with bowel movements. He did notice some blood in his urine. He denies NSAID use. No alcohol or drug use.  In the emergency department afebrile, vital signs stable. Pertinent labs: Total bilirubin 6, AST 7.9, ALT 406, alkaline phosphatase 142. Hemoglobin 11.8. Alcohol level negative. Fecal occult blood positive. Urine drug screen negative. EKG: Independently reviewed. Sinus tachycardia Imaging: Chest x-Cletis no acute disease, abdominal film no acute disease. Right upper quadrant ultrasound cholelithiasis without cholecystitis.  Hospital Course:   Upper GI bleed -Secondary to Mallory-Weiss tear, status post EGD on 3/15. -To  continue twice a day PPI and Carafate on discharge. -GI office to provide him with medications to treat his H. pylori.  Acute blood loss anemia secondary to upper GI bleed -Was transfused 2 units on 3/19 in preparation for surgery.  Elevated LFTs -Thought to be secondary to hepatitis B, will follow up with GI for treatment at a later date.  Cholelithiasis  -Status post cholecystectomy 3/20.  Procedures:  Cholecystectomy  EGD   Consultations:  Surgery  GI  Discharge Instructions  Discharge Instructions    Diet - low sodium heart healthy    Complete by:  As directed      Increase activity slowly    Complete by:  As directed             Medication List    TAKE these medications        oxyCODONE 5 MG immediate release tablet  Commonly known as:  Oxy IR/ROXICODONE  Take 1-2 tablets (5-10 mg total) by mouth every 4 (four) hours as needed for moderate pain.     pantoprazole 40 MG tablet  Commonly known as:  PROTONIX  Take 1 tablet (40 mg total) by mouth 2 (two) times daily before a meal.     sucralfate 1 GM/10ML suspension  Commonly known as:  CARAFATE  Take 10 mLs (1 g total) by mouth 4 (four) times daily -  with meals and at bedtime.       No Known Allergies     Follow-up Information    [facility] .   Why:  Follow up for outpatient psychiatric care.   Contact information:   [address], [place],  [place] [address]  [phone]       Follow up with Dalia Heading, MD. Schedule an appointment as soon as possible for a visit on 04/27/2015.   Specialty:  General Surgery   Contact information:   [address] [place] [address] [phone]       Follow up with [facility] On 05/04/2015.   Why:  1pm - Bring ID and proof of f household income   Contact information:   [address] [place] [address] [phone]       Follow up with Dalia Heading, MD. Schedule an appointment as soon as possible for a visit in  2 weeks.   Specialty:  General Surgery   Why:  For staple removal   Contact information:   [address] [place] [address] [phone]       Follow up with Malissa Hippo, MD.   Specialty:  Gastroenterology   Why:  office will call you with appointment date and time   Contact information:   [address], [address] [place] [address] [phone]        The results of significant diagnostics from this hospitalization (including imaging, microbiology, ancillary and laboratory) are listed below for reference.    Significant Diagnostic Studies: Dg Abd Acute W/chest  04/07/2015  CLINICAL DATA:  Hematuria, vomiting blood, fever EXAM: DG ABDOMEN ACUTE W/ 1V CHEST COMPARISON:  None. FINDINGS: No active infiltrate or effusion is seen. Mediastinal and hilar contours are unremarkable. The heart is borderline enlarged. Supine and erect views of the abdomen show no bowel obstruction. No free air is seen on the best erect view possible. Calcifications are present within the right upper quadrant which could represent calcified gallstones, not appearing to overlay the right renal shadow. There are degenerative changes in the lower lumbar spine. IMPRESSION: 1. No bowel obstruction.  No free air on the best films possible. 2. Question gallstones in the right upper quadrant. 3. No active lung disease. Electronically Signed   By: Dwyane Dee M.D.   On: 04/07/2015 10:36   US Abdomen Limited Ruq  04/07/2015  CLINICAL DATA:  50 year old male the history of mid abdominal pain and rectal bleeding. EXAM: US ABDOMEN LIMITED - RIGHT UPPER QUADRANT COMPARISON:  Plain film 04/07/2015 FINDINGS: Gallbladder: Multiple echogenic foci within the lumen of the gallbladder. Gallbladder wall measures approximately 4 mm -5 mm. No pericholecystic fluid. Sonographic Murphy's sign negative. Common bile duct: Diameter: 5 mm Liver: Heterogeneous echotexture of the liver. IMPRESSION: Sonographic survey demonstrates  cholelithiasis without sonographic evidence of acute cholecystitis. Liver steatosis. Signed, Yvone Neu. Loreta Ave, DO [facility] Electronically Signed   By: Gilmer Mor D.O.   On: 04/07/2015 11:05    Microbiology: Recent Results (from the past 240 hour(s))  Surgical pcr screen     Status: None   Collection Time: 04/11/15  5:25 AM  Result Value Ref Range Status   MRSA, PCR NEGATIVE NEGATIVE Final   Staphylococcus aureus NEGATIVE NEGATIVE Final    Comment:        The Xpert SA Assay (FDA approved for NASAL specimens in patients over 55 years of age), is one component of a comprehensive surveillance program.  Test performance has been validated by [facility] for patients greater than or equal to 85 year old. It is not intended to diagnose infection nor to guide or monitor treatment.      Labs: Basic Metabolic Panel:  Recent Labs Lab  04/08/15 0215 04/09/15 0610 04/11/15 0621 04/13/15 0535  NA 138 138 138 137  K 3.8 4.2 4.1 4.7  CL 107 106 106 107  CO2 GLUCOSE 100* 97 88 84  BUN 21* CREATININE 0.97 0.90 0.89 0.78  CALCIUM 7.9* 8.1* 8.2* 8.1*  MG  --   --   --  1.8  PHOS  --   --   --  3.3   Liver Function Tests:  Recent Labs Lab 04/09/15 0610 04/10/15 0530 04/11/15 0621 04/13/15 0535 04/13/15 2350  AST 586* 568* 259* 500* 467*  ALT 311* 300* 223* 267* 254*  ALKPHOS 111 123 109 126 132*  BILITOT 4.4* 3.7* 0.4 4.1* 4.2*  PROT 6.1* 6.2* 5.7* 6.3* 6.7  ALBUMIN 2.1* 2.1* 3.4* 2.1* 2.2*   No results for input(s): LIPASE, AMYLASE in the last 168 hours. No results for input(s): AMMONIA in the last 168 hours. CBC:  Recent Labs Lab 04/10/15 0530 04/11/15 0621 04/11/15 1849 04/13/15 0535 04/13/15 2350  WBC 6.8 6.1 7.6 9.9 9.6  NEUTROABS  --   --  4.0  --   --   HGB 8.5* 8.8* 10.5* 10.6* 10.6*  HCT 25.5* 26.7* 30.5* 30.6* 31.2*  MCV 103.2* 104.3* 101.3* 101.3* 101.0*  PLT 98* 90*  91* 96* 89*   Cardiac Enzymes: No results for input(s): CKTOTAL, CKMB, CKMBINDEX, TROPONINI in the last 168 hours. BNP: BNP (last 3 results) No results for input(s): BNP in the last 8760 hours.  ProBNP (last 3 results) No results for input(s): PROBNP in the last 8760 hours.  CBG: No results for input(s): GLUCAP in the last 168 hours.     SignedChaya Jan  [facility] Pager: [phone] 04/14/2015, 4:47 PM

## 2015-04-15 LAB — TYPE AND SCREEN
ABO/RH(D): O POS
Antibody Screen: NEGATIVE
UNIT DIVISION: 0
Unit division: 0
Unit division: 0

## 2015-04-15 LAB — HIV ANTIBODY (ROUTINE TESTING W REFLEX): HIV SCREEN 4TH GENERATION: NONREACTIVE

## 2015-04-21 ENCOUNTER — Encounter (INDEPENDENT_AMBULATORY_CARE_PROVIDER_SITE_OTHER): Payer: Self-pay | Admitting: *Deleted

## 2015-04-21 ENCOUNTER — Ambulatory Visit (INDEPENDENT_AMBULATORY_CARE_PROVIDER_SITE_OTHER): Payer: Self-pay | Admitting: Internal Medicine

## 2015-04-21 ENCOUNTER — Encounter (INDEPENDENT_AMBULATORY_CARE_PROVIDER_SITE_OTHER): Payer: Self-pay | Admitting: Internal Medicine

## 2015-04-21 VITALS — BP 110/70 | HR 74 | Temp 98.2°F | Ht 69.0 in | Wt 331.9 lb

## 2015-04-21 DIAGNOSIS — B169 Acute hepatitis B without delta-agent and without hepatic coma: Secondary | ICD-10-CM

## 2015-04-21 DIAGNOSIS — B1910 Unspecified viral hepatitis B without hepatic coma: Secondary | ICD-10-CM | POA: Insufficient documentation

## 2015-04-21 LAB — CBC WITH DIFFERENTIAL/PLATELET
Basophils Absolute: 0.1 10*3/uL (ref 0.0–0.1)
Basophils Relative: 1 % (ref 0–1)
Eosinophils Absolute: 0.1 10*3/uL (ref 0.0–0.7)
Eosinophils Relative: 2 % (ref 0–5)
HEMATOCRIT: 32 % — AB (ref 39.0–52.0)
HEMOGLOBIN: 10.8 g/dL — AB (ref 13.0–17.0)
LYMPHS PCT: 28 % (ref 12–46)
Lymphs Abs: 1.7 10*3/uL (ref 0.7–4.0)
MCH: 33.6 pg (ref 26.0–34.0)
MCHC: 33.8 g/dL (ref 30.0–36.0)
MCV: 99.7 fL (ref 78.0–100.0)
MONO ABS: 0.8 10*3/uL (ref 0.1–1.0)
MONOS PCT: 13 % — AB (ref 3–12)
MPV: 11.1 fL (ref 8.6–12.4)
NEUTROS ABS: 3.4 10*3/uL (ref 1.7–7.7)
Neutrophils Relative %: 56 % (ref 43–77)
Platelets: 139 10*3/uL — ABNORMAL LOW (ref 150–400)
RBC: 3.21 MIL/uL — ABNORMAL LOW (ref 4.22–5.81)
RDW: 15.4 % (ref 11.5–15.5)
WBC: 6 10*3/uL (ref 4.0–10.5)

## 2015-04-21 LAB — HEPATIC FUNCTION PANEL
ALK PHOS: 138 U/L — AB (ref 40–115)
ALT: 157 U/L — AB (ref 9–46)
AST: 384 U/L — ABNORMAL HIGH (ref 10–40)
Albumin: 2.4 g/dL — ABNORMAL LOW (ref 3.6–5.1)
BILIRUBIN INDIRECT: 1.9 mg/dL — AB (ref 0.2–1.2)
Bilirubin, Direct: 2.3 mg/dL — ABNORMAL HIGH (ref <=0.2)
TOTAL PROTEIN: 6.8 g/dL (ref 6.1–8.1)
Total Bilirubin: 4.2 mg/dL — ABNORMAL HIGH (ref 0.2–1.2)

## 2015-04-21 NOTE — Progress Notes (Signed)
Subjective:    Patient ID: Jesus [name], male    DOB: 1965/12/19, 50 y.o.   MRN: [id number]  HPI Here today for f/u after recent admission to [facility] 04/07/2015 for GI bleed. He had nausea and vomiting and noted to have dark red vomitus.  He had upper and mid-abdominal pian.     Noted to have a low hemoglobin in the ED.  LFTs were also elevated.  He underwent an EGD 04/07/2015 which revealed a linear esophageal ulcer across GE junction to his lesser curvature consistent with Clayborne ArtistMallory Weiss tear. Normal upper third of esophagus and middle third of esophagus. Three small antral ulcers and 10mm prepyloric ulcer noted without stigmata of bleed. No specimen collected.  Found to be H. Pylori positive and started on Pylera. Samples given from our office.  He was also found to be Hepatitis B postive. [name] apparently was not collected.  04/12/2015 He also underwent a lap cholecystectomy on 04/12/2015 for cholecystitis, cholelithiasis.  Patient refused liver biopsy. He tells me he is doing good. Appetite is good. No weight. He says he has a BM daily. No melena or BRRB. Some tenderness at Lap chole site. Staple have redness around them.  He denies hx of IV drug use.       He is divorced. 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 He has 3 grownup children. He has 2 sons and daughter. His daughter Jesus [name] is at bedside. His father died at age 49 1 week after sustaining abdominal injury at salt meal.. He apparently had internal injury. Her mother died of MI at age 49. He has 2 brothers and 4 sisters. One brother is hypertensive. One brother and one sister has been treated for peptic ulcer disease/upper GI bleed  04/09/2015 AFP 35  CBC    Component Value Date/Time   WBC 9.6 04/13/2015 2350   RBC 3.09* 04/13/2015 2350   RBC 3.20* 04/07/2015 0755   HGB 10.6* 04/13/2015 2350   HCT 31.2* 04/13/2015 2350   PLT 89* 04/13/2015 2350   MCV 101.0* 04/13/2015 2350   MCH 34.3* 04/13/2015 2350   MCHC 34.0  04/13/2015 2350   RDW 17.6* 04/13/2015 2350   LYMPHSABS 2.6 04/11/2015 1849   MONOABS 0.8 04/11/2015 1849   EOSABS 0.2 04/11/2015 1849   BASOSABS 0.0 04/11/2015 1849   Hepatic Function Panel     Component Value Date/Time   PROT 6.7 04/13/2015 2350   ALBUMIN 2.2* 04/13/2015 2350   AST 467* 04/13/2015 2350   ALT 254* 04/13/2015 2350   ALKPHOS 132* 04/13/2015 2350   BILITOT 4.2* 04/13/2015 2350   BILIDIR 2.2* 04/13/2015 2350   IBILI 2.0* 04/13/2015 2350     .    Review of Systems Past Medical History  Diagnosis Date  . Hepatitis     hep B    Past Surgical History  Procedure Laterality Date  . None    . Esophagogastroduodenoscopy N/A 04/07/2015    Procedure: ESOPHAGOGASTRODUODENOSCOPY (EGD);  Surgeon: Malissa HippoNajeeb U Rehman, MD;  Location: [facility] ENDO SUITE;  Service: Endoscopy;  Laterality: N/A;  . Cholecystectomy N/A 04/12/2015    Procedure: LAPAROSCOPIC CHOLECYSTECTOMY;  Surgeon: Franky MachoMark Jenkins, MD;  Location: [facility] ORS;  Service: General;  Laterality: N/A;    No Known Allergies  Current Outpatient Prescriptions on File Prior to Visit  Medication Sig Dispense Refill  . oxyCODONE (OXY IR/ROXICODONE) 5 MG immediate release tablet Take 1-2 tablets (5-10 mg total) by mouth every 4 (four) hours as needed for  moderate pain. 20 tablet 0  . pantoprazole (PROTONIX) 40 MG tablet Take 1 tablet (40 mg total) by mouth 2 (two) times daily before a meal. 60 tablet 1  . sucralfate (CARAFATE) 1 GM/10ML suspension Take 10 mLs (1 g total) by mouth 4 (four) times daily -  with meals and at bedtime. 420 mL 0   No current facility-administered medications on file prior to visit.            Objective:   Physical Exam Blood pressure 110/70, pulse 74, temperature 98.2 F (36.8 C), height  (1.753 m), weight 331 lb 14.4 oz (150.549 kg). Alert and oriented. Skin warm and dry. Oral mucosa is moist.   . Sclera anicteric, conjunctivae is pink. Thyroid not enlarged. No cervical lymphadenopathy. Lungs  clear. Heart regular rate and rhythm.  Abdomen is soft. Bowel sounds are positive. No hepatomegaly. No abdominal masses felt. Tenderness over staple sites. Abdomen is obese. Bruising left mid abdomen. .  No edema to lower extremities        Assessment & Plan:  Thrombocytopenia probably related to his liver disease. [place] elast. Will be scheduled.  PUD/ H. Pylori: Presently taking Pylera. Has 5 more days. Hepatitis B. Will get a [name], CBC, Hepatic function today. OV in 3 months. Hepatitis B. Will check quaint.  OV in 3 months.

## 2015-04-21 NOTE — Patient Instructions (Signed)
OV in 3 months. 

## 2015-04-22 ENCOUNTER — Telehealth (INDEPENDENT_AMBULATORY_CARE_PROVIDER_SITE_OTHER): Payer: Self-pay | Admitting: Internal Medicine

## 2015-04-22 LAB — DRUG SCREEN, URINE
Amphetamine Screen, Ur: NEGATIVE
Barbiturate Quant, Ur: NEGATIVE
Benzodiazepines.: NEGATIVE
CREATININE, U: 296.59 mg/dL
Cocaine Metabolites: NEGATIVE
MARIJUANA METABOLITE: NEGATIVE
METHADONE: NEGATIVE
OPIATES: NEGATIVE
PROPOXYPHENE: NEGATIVE
Phencyclidine (PCP): NEGATIVE

## 2015-04-22 NOTE — Telephone Encounter (Signed)
Samples to front. I have spoken to patient

## 2015-04-22 NOTE — Telephone Encounter (Signed)
Please call him and tell him to take the Prilosec after tx once a day.  Take the Pylera for a total of 10 days.

## 2015-04-22 NOTE — Telephone Encounter (Signed)
Mr. Jesus [name] has been informed on how to take his medication as you suggested. Thank you.

## 2015-04-22 NOTE — Telephone Encounter (Signed)
Mr. Thad RangerReynolds came and picked up the Prilosec samples. He also asked if he needs to take the ten days worth only or if he needs to continue taking the medication until his next follow-up appointment. In addition to the Prilosec, he's wondering if he needs to take Pylera for ten days only or until his next appointment. He'd like a phone call regarding this.  Pt's ph#  [phone]  Thank you.

## 2015-04-22 NOTE — Telephone Encounter (Signed)
Mr. Jesus [name] called saying he's supposed to have a 10 day supply of Prilosec and he needs two more packs. He's wondering if he can pick up two packs of the purple Prilosec sample. He'd like a phone call regarding this.  Pt's ph# [phone] Thank you.

## 2015-04-23 ENCOUNTER — Other Ambulatory Visit (INDEPENDENT_AMBULATORY_CARE_PROVIDER_SITE_OTHER): Payer: Self-pay | Admitting: Internal Medicine

## 2015-04-23 DIAGNOSIS — B1910 Unspecified viral hepatitis B without hepatic coma: Secondary | ICD-10-CM

## 2015-04-23 LAB — HEPATITIS B DNA, ULTRAQUANTITATIVE, PCR
HEPATITIS B DNA (CALC): 8.21 {Log_IU}/mL — AB (ref <1.30)
Hepatitis B DNA: 162930336 IU/mL — ABNORMAL HIGH (ref <20)

## 2015-04-23 LAB — NGI HBV SUPERQUANT

## 2015-04-23 LAB — NGI HBV ENDPOINT DILUTION: NGI HBV ENDPOINT DILUTION: 450000000 {copies}/mL

## 2015-04-26 ENCOUNTER — Ambulatory Visit (HOSPITAL_COMMUNITY)
Admission: RE | Admit: 2015-04-26 | Discharge: 2015-04-26 | Disposition: A | Discharge status: Home / Self Care | Payer: Self-pay | Source: Ambulatory Visit | Attending: Internal Medicine | Admitting: Internal Medicine

## 2015-04-26 DIAGNOSIS — B1910 Unspecified viral hepatitis B without hepatic coma: Secondary | ICD-10-CM

## 2015-04-26 DIAGNOSIS — Z9049 Acquired absence of other specified parts of digestive tract: Secondary | ICD-10-CM | POA: Insufficient documentation

## 2015-04-26 DIAGNOSIS — B169 Acute hepatitis B without delta-agent and without hepatic coma: Secondary | ICD-10-CM | POA: Insufficient documentation

## 2015-04-26 DIAGNOSIS — R932 Abnormal findings on diagnostic imaging of liver and biliary tract: Secondary | ICD-10-CM | POA: Insufficient documentation

## 2015-05-13 ENCOUNTER — Telehealth (INDEPENDENT_AMBULATORY_CARE_PROVIDER_SITE_OTHER): Payer: Self-pay | Admitting: *Deleted

## 2015-05-13 DIAGNOSIS — B1910 Unspecified viral hepatitis B without hepatic coma: Secondary | ICD-10-CM

## 2015-05-13 NOTE — Telephone Encounter (Signed)
Per Dr.Rehman the patient will need to have labs drawn prior to OV,06/10/2015.

## 2015-05-24 ENCOUNTER — Encounter (HOSPITAL_COMMUNITY): Payer: Self-pay | Admitting: General Surgery

## 2015-06-07 ENCOUNTER — Telehealth (INDEPENDENT_AMBULATORY_CARE_PROVIDER_SITE_OTHER): Payer: Self-pay | Admitting: *Deleted

## 2015-06-07 NOTE — Telephone Encounter (Signed)
Patient presented to the office this afternoon, to be weighed. He states that he has been exercising walking 1 1/2 mile ,and just wanted to be weighed. Previous weight on 04/21/2015 was 331 lbs 14.4 oz , His weight today was 326 lbs 3 oz. Patient has an appointment with us on 06/10/2015.

## 2015-06-10 ENCOUNTER — Ambulatory Visit (INDEPENDENT_AMBULATORY_CARE_PROVIDER_SITE_OTHER): Payer: Self-pay | Admitting: Internal Medicine

## 2015-06-10 ENCOUNTER — Telehealth (INDEPENDENT_AMBULATORY_CARE_PROVIDER_SITE_OTHER): Payer: Self-pay | Admitting: *Deleted

## 2015-06-10 DIAGNOSIS — B1910 Unspecified viral hepatitis B without hepatic coma: Secondary | ICD-10-CM

## 2015-06-10 LAB — HEPATIC FUNCTION PANEL
ALBUMIN: 2.7 g/dL — AB (ref 3.6–5.1)
ALT: 115 U/L — ABNORMAL HIGH (ref 9–46)
AST: 245 U/L — ABNORMAL HIGH (ref 10–40)
Alkaline Phosphatase: 208 U/L — ABNORMAL HIGH (ref 40–115)
BILIRUBIN TOTAL: 2.4 mg/dL — AB (ref 0.2–1.2)
Bilirubin, Direct: 1.1 mg/dL — ABNORMAL HIGH (ref <=0.2)
Indirect Bilirubin: 1.3 mg/dL — ABNORMAL HIGH (ref 0.2–1.2)
TOTAL PROTEIN: 7.2 g/dL (ref 6.1–8.1)

## 2015-06-10 LAB — CBC
HCT: 34.4 % — ABNORMAL LOW (ref 38.5–50.0)
Hemoglobin: 11.4 g/dL — ABNORMAL LOW (ref 13.2–17.1)
MCH: 32.5 pg (ref 27.0–33.0)
MCHC: 33.1 g/dL (ref 32.0–36.0)
MCV: 98 fL (ref 80.0–100.0)
MPV: 12.1 fL (ref 7.5–12.5)
PLATELETS: 100 10*3/uL — AB (ref 140–400)
RBC: 3.51 MIL/uL — ABNORMAL LOW (ref 4.20–5.80)
RDW: 15 % (ref 11.0–15.0)
WBC: 6 10*3/uL (ref 3.8–10.8)

## 2015-06-10 NOTE — Telephone Encounter (Signed)
Per Dr.Rehman the patient will need to have labs drawn. 

## 2015-06-14 ENCOUNTER — Encounter (INDEPENDENT_AMBULATORY_CARE_PROVIDER_SITE_OTHER): Payer: Self-pay | Admitting: Internal Medicine

## 2015-06-14 ENCOUNTER — Ambulatory Visit (INDEPENDENT_AMBULATORY_CARE_PROVIDER_SITE_OTHER): Payer: Self-pay | Admitting: Internal Medicine

## 2015-06-14 ENCOUNTER — Other Ambulatory Visit (INDEPENDENT_AMBULATORY_CARE_PROVIDER_SITE_OTHER): Payer: Self-pay | Admitting: *Deleted

## 2015-06-14 ENCOUNTER — Encounter (INDEPENDENT_AMBULATORY_CARE_PROVIDER_SITE_OTHER): Payer: Self-pay | Admitting: *Deleted

## 2015-06-14 ENCOUNTER — Telehealth (INDEPENDENT_AMBULATORY_CARE_PROVIDER_SITE_OTHER): Payer: Self-pay | Admitting: *Deleted

## 2015-06-14 VITALS — BP 126/80 | HR 74 | Temp 97.7°F | Resp 18 | Ht 69.0 in | Wt 325.5 lb

## 2015-06-14 DIAGNOSIS — D638 Anemia in other chronic diseases classified elsewhere: Secondary | ICD-10-CM

## 2015-06-14 DIAGNOSIS — K7460 Unspecified cirrhosis of liver: Secondary | ICD-10-CM

## 2015-06-14 DIAGNOSIS — B1910 Unspecified viral hepatitis B without hepatic coma: Secondary | ICD-10-CM

## 2015-06-14 DIAGNOSIS — D649 Anemia, unspecified: Secondary | ICD-10-CM

## 2015-06-14 DIAGNOSIS — K279 Peptic ulcer, site unspecified, unspecified as acute or chronic, without hemorrhage or perforation: Secondary | ICD-10-CM

## 2015-06-14 DIAGNOSIS — K7469 Other cirrhosis of liver: Secondary | ICD-10-CM

## 2015-06-14 DIAGNOSIS — B169 Acute hepatitis B without delta-agent and without hepatic coma: Secondary | ICD-10-CM

## 2015-06-14 DIAGNOSIS — B181 Chronic viral hepatitis B without delta-agent: Secondary | ICD-10-CM

## 2015-06-14 MED ORDER — OMEPRAZOLE 20 MG PO CPDR: 20.0000 mg | DELAYED_RELEASE_CAPSULE | Freq: Every day | ORAL | Status: DC

## 2015-06-14 NOTE — Progress Notes (Signed)
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  Database:  Patient is 50 year old African male was hospitalized over 10 weeks ago with jaundice and upper GI bleed. He received 2 units of PRBCs. Revealed Mallory-Weiss tear and for gastric ulcers. H. pylori serology was positive and he was treated with Pylera following his last visit to our office 6 weeks ago. During his hospitalization he was found to have chronic hepatitis B cirrhosis L as well as cholelithiasis. He underwent laparoscopic cholecystectomy by Dr. Lovell SheehanJenkins who noted liver to be cirrhotic but liver biopsy was not performed.  Patient was begun and tach of air on 05/10/2015.   Subjective:  Patient is here for scheduled visit. He was last seen on [phone]. He states he is feeling great. He walks 1-1/2-2 miles every day. He's cut out sodas. He has lost another 7 pounds. He has not taken pantoprazole for several weeks. He states he could not afford the medication. He denies nausea vomiting abdominal pain melena or rectal bleeding. He was able to finish Pylera without any side effects. As not take OTC NSAIDs and has not needed pain medication since he was discharged from the hospital. He is having no side effects with entecavir. He says he has not missed a dose.   Current Medications: Outpatient Encounter Prescriptions as of 06/14/2015  Medication Sig  . entecavir (BARACLUDE) 1 MG tablet Take 1 mg by mouth daily.  Marland Kitchen. oxyCODONE (OXY IR/ROXICODONE) 5 MG immediate release tablet Take 1-2 tablets (5-10 mg total) by mouth every 4 (four) hours as needed for moderate pain.  Marland Kitchen. omeprazole (PRILOSEC) 20 MG capsule Take 1 capsule (20 mg total) by mouth daily before breakfast.  . [DISCONTINUED] pantoprazole (PROTONIX) 40 MG tablet Take 1 tablet (40 mg total) by mouth 2 (two) times daily before a meal. (Patient not taking: Reported on 06/14/2015)  . [DISCONTINUED] sucralfate (CARAFATE) 1 GM/10ML suspension  Take 10 mLs (1 g total) by mouth 4 (four) times daily -  with meals and at bedtime. (Patient not taking: Reported on 06/14/2015)   No facility-administered encounter medications on file as of 06/14/2015.     Objective: Blood pressure 126/80, pulse 74, temperature 97.7 F (36.5 C), temperature source Oral, resp. rate 18, height 5\' 9"  (1.753 m), weight 325 lb 8 oz (147.646 kg). Patient is alert and does not have asterixis. Conjunctiva is pink. Sclera is nonicteric Oropharyngeal mucosa is normal. No neck masses or thyromegaly noted. Cardiac exam with regular rhythm normal S1 and S2. No murmur or gallop noted. Lungs are clear to auscultation. Abdomen is full but soft and nontender. Difficult to palpate liver or spleen. No obvious organomegaly or masses. No LE edema or clubbing noted.  Labs/studies Results: Lab data from 06/10/2015 WBC 6.0, H&H 11.4 and 34.4 and platelet count 100 K. Total bilirubin 2.4, AP 208, AST 245, ALT 115, total protein 7.2 and albumin 2.7.   Assessment:  #1. Chronic hepatitis B with cirrhosis. Patient is starting week 5 on entecavir and having no side effects. Transaminases are trending down and albumin is going up which is reassuring. He possibly will stay on entecavir for more than 5 years or indefinitely unless he seroconverts. #2. Anemia. H&H is coming up. It remains to be seen if H&H will return to normal or stay low which would mean anemia of chronic disease. #3. Peptic ulcer disease. He had multiple gastric ulcers on EGD 10 weeks ago. He has been treated for H. pylori infection. He needs  to be back on PPI until these ulcers have healed. #4. 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  Plan:  Prilosec OTC 20 mg by mouth every morning. He will take it daily until EGD in 4 weeks from now. Patient will have CBC, comprehensive chemistry panel and HBV DNA by PCR in 8 weeks prior to office visit.

## 2015-06-14 NOTE — Patient Instructions (Signed)
Take Prilosec 20 mg by mouth 30 minutes before breakfast for one month. EGD to be performed next month.

## 2015-06-14 NOTE — Telephone Encounter (Signed)
Per Dr.Rehman the patient will need to have labs drawn the 3rd week in July 2017.

## 2015-07-05 ENCOUNTER — Telehealth (INDEPENDENT_AMBULATORY_CARE_PROVIDER_SITE_OTHER): Payer: Self-pay | Admitting: Internal Medicine

## 2015-07-05 ENCOUNTER — Telehealth (INDEPENDENT_AMBULATORY_CARE_PROVIDER_SITE_OTHER): Payer: Self-pay | Admitting: *Deleted

## 2015-07-05 NOTE — Telephone Encounter (Signed)
Patient presented to the office this morning, stated that he would like Dr. Karilyn Cotaehman to give him a note for clearance to go back to work.  I did ask the patient what doctor took him out of work and he stated no one, that he just hasn't been able to work.  Offered him an appointment to discuss with Dr. Karilyn Cotaehman and he stated he was recently seen and requested we just check with Dr. Karilyn Cotaehman.  [phone] (brothers phone)

## 2015-07-05 NOTE — Telephone Encounter (Signed)
Patient canceled EGD sch'd for this Friday, 07/09/15

## 2015-07-05 NOTE — Telephone Encounter (Signed)
Per Dr.Rehman - under a GI he is stable. However a clearance must come from patient's PCP. We may also wait until patient's lab in July to discuss this. Patient was called and made aware , that the clearance to return to work should come from his PCP.

## 2015-07-05 NOTE — Telephone Encounter (Signed)
Patient also weighed in . He weighed 322 lbs. His last weight was 326 lbs. 3 oz on 06/07/2015.

## 2015-07-09 ENCOUNTER — Encounter (HOSPITAL_COMMUNITY): Payer: Self-pay

## 2015-07-09 ENCOUNTER — Ambulatory Visit (HOSPITAL_COMMUNITY): Admit: 2015-07-09 | Payer: MEDICAID | Admitting: Internal Medicine

## 2015-07-09 SURGERY — EGD (ESOPHAGOGASTRODUODENOSCOPY)
Anesthesia: Moderate Sedation

## 2015-07-15 ENCOUNTER — Encounter (INDEPENDENT_AMBULATORY_CARE_PROVIDER_SITE_OTHER): Payer: Self-pay | Admitting: *Deleted

## 2015-07-15 ENCOUNTER — Other Ambulatory Visit (INDEPENDENT_AMBULATORY_CARE_PROVIDER_SITE_OTHER): Payer: Self-pay | Admitting: *Deleted

## 2015-07-15 DIAGNOSIS — B181 Chronic viral hepatitis B without delta-agent: Secondary | ICD-10-CM

## 2015-07-15 DIAGNOSIS — D649 Anemia, unspecified: Secondary | ICD-10-CM

## 2015-07-15 DIAGNOSIS — K7460 Unspecified cirrhosis of liver: Principal | ICD-10-CM

## 2015-07-15 DIAGNOSIS — K279 Peptic ulcer, site unspecified, unspecified as acute or chronic, without hemorrhage or perforation: Secondary | ICD-10-CM

## 2015-07-22 ENCOUNTER — Ambulatory Visit (INDEPENDENT_AMBULATORY_CARE_PROVIDER_SITE_OTHER): Payer: Self-pay | Admitting: Internal Medicine

## 2015-07-26 ENCOUNTER — Encounter (INDEPENDENT_AMBULATORY_CARE_PROVIDER_SITE_OTHER): Payer: Self-pay | Admitting: *Deleted

## 2015-07-26 ENCOUNTER — Telehealth (INDEPENDENT_AMBULATORY_CARE_PROVIDER_SITE_OTHER): Payer: Self-pay | Admitting: *Deleted

## 2015-07-26 NOTE — Telephone Encounter (Signed)
Open In Error.

## 2015-08-17 ENCOUNTER — Ambulatory Visit (INDEPENDENT_AMBULATORY_CARE_PROVIDER_SITE_OTHER): Payer: Self-pay | Admitting: Internal Medicine

## 2015-08-18 ENCOUNTER — Encounter (INDEPENDENT_AMBULATORY_CARE_PROVIDER_SITE_OTHER): Payer: Self-pay | Admitting: Internal Medicine

## 2015-08-19 ENCOUNTER — Telehealth (INDEPENDENT_AMBULATORY_CARE_PROVIDER_SITE_OTHER): Payer: Self-pay | Admitting: *Deleted

## 2015-08-19 NOTE — Telephone Encounter (Signed)
I called the patient 's brother , Jesus [name] and his daughter , Jesus [name] on 08/18/2015. Patient missed his appointment on 08/17/2015. We also have recd' his 3 month supply of Baralude for the patient and he has not come to pick it up. His brother states that he has gone back to work,driving a truck. He also says that the patient has been acting out of late. He has tried to encourage him to keep up with his health and appointments,ect. I talked with the patient 's daughter. She says that she will try and talk to her Dad. He had implied to her that he was doing well , keeping up with appointments and taking his medications. She plans to come to our office today and get the medication and take it to her Dad and try to talk to him. It was stressed the importance to both his brother and daughter that the patient is not to have missed any medications,and now he has. He also needs to get lab work done that was ordered in June. It is very important that the patient be compliant for the continuity of his care.

## 2016-11-17 ENCOUNTER — Inpatient Hospital Stay (HOSPITAL_COMMUNITY)
Admission: EM | Admit: 2016-11-17 | Discharge: 2016-11-23 | DRG: 441 | Disposition: E | Payer: Medicaid Other | Attending: Pulmonary Disease | Admitting: Pulmonary Disease

## 2016-11-17 ENCOUNTER — Emergency Department (HOSPITAL_COMMUNITY): Payer: Medicaid Other

## 2016-11-17 ENCOUNTER — Encounter (HOSPITAL_COMMUNITY): Payer: Self-pay | Admitting: *Deleted

## 2016-11-17 DIAGNOSIS — E871 Hypo-osmolality and hyponatremia: Secondary | ICD-10-CM | POA: Diagnosis present

## 2016-11-17 DIAGNOSIS — K7200 Acute and subacute hepatic failure without coma: Secondary | ICD-10-CM | POA: Diagnosis present

## 2016-11-17 DIAGNOSIS — R570 Cardiogenic shock: Secondary | ICD-10-CM | POA: Diagnosis not present

## 2016-11-17 DIAGNOSIS — R34 Anuria and oliguria: Secondary | ICD-10-CM | POA: Diagnosis present

## 2016-11-17 DIAGNOSIS — K922 Gastrointestinal hemorrhage, unspecified: Secondary | ICD-10-CM | POA: Diagnosis present

## 2016-11-17 DIAGNOSIS — K254 Chronic or unspecified gastric ulcer with hemorrhage: Secondary | ICD-10-CM | POA: Diagnosis present

## 2016-11-17 DIAGNOSIS — Z9049 Acquired absence of other specified parts of digestive tract: Secondary | ICD-10-CM

## 2016-11-17 DIAGNOSIS — E8770 Fluid overload, unspecified: Secondary | ICD-10-CM | POA: Diagnosis present

## 2016-11-17 DIAGNOSIS — D696 Thrombocytopenia, unspecified: Secondary | ICD-10-CM | POA: Diagnosis present

## 2016-11-17 DIAGNOSIS — R791 Abnormal coagulation profile: Secondary | ICD-10-CM | POA: Diagnosis present

## 2016-11-17 DIAGNOSIS — R4182 Altered mental status, unspecified: Secondary | ICD-10-CM | POA: Diagnosis present

## 2016-11-17 DIAGNOSIS — R188 Other ascites: Secondary | ICD-10-CM | POA: Diagnosis present

## 2016-11-17 DIAGNOSIS — E162 Hypoglycemia, unspecified: Secondary | ICD-10-CM | POA: Diagnosis present

## 2016-11-17 DIAGNOSIS — B181 Chronic viral hepatitis B without delta-agent: Secondary | ICD-10-CM | POA: Diagnosis present

## 2016-11-17 DIAGNOSIS — N170 Acute kidney failure with tubular necrosis: Secondary | ICD-10-CM | POA: Diagnosis present

## 2016-11-17 DIAGNOSIS — D539 Nutritional anemia, unspecified: Secondary | ICD-10-CM | POA: Diagnosis present

## 2016-11-17 DIAGNOSIS — Z66 Do not resuscitate: Secondary | ICD-10-CM | POA: Diagnosis present

## 2016-11-17 DIAGNOSIS — K767 Hepatorenal syndrome: Secondary | ICD-10-CM | POA: Diagnosis present

## 2016-11-17 DIAGNOSIS — Z8249 Family history of ischemic heart disease and other diseases of the circulatory system: Secondary | ICD-10-CM

## 2016-11-17 DIAGNOSIS — K7460 Unspecified cirrhosis of liver: Secondary | ICD-10-CM

## 2016-11-17 DIAGNOSIS — R748 Abnormal levels of other serum enzymes: Secondary | ICD-10-CM

## 2016-11-17 DIAGNOSIS — K652 Spontaneous bacterial peritonitis: Secondary | ICD-10-CM

## 2016-11-17 DIAGNOSIS — Z452 Encounter for adjustment and management of vascular access device: Secondary | ICD-10-CM

## 2016-11-17 DIAGNOSIS — Z9114 Patient's other noncompliance with medication regimen: Secondary | ICD-10-CM

## 2016-11-17 DIAGNOSIS — D72829 Elevated white blood cell count, unspecified: Secondary | ICD-10-CM | POA: Diagnosis present

## 2016-11-17 LAB — CBC WITH DIFFERENTIAL/PLATELET
BASOS ABS: 0.1 10*3/uL (ref 0.0–0.1)
Basophils Relative: 0 %
EOS PCT: 1 %
Eosinophils Absolute: 0.1 10*3/uL (ref 0.0–0.7)
HCT: 31.6 % — ABNORMAL LOW (ref 39.0–52.0)
Hemoglobin: 10.9 g/dL — ABNORMAL LOW (ref 13.0–17.0)
LYMPHS PCT: 14 %
Lymphs Abs: 2.6 10*3/uL (ref 0.7–4.0)
MCH: 36 pg — AB (ref 26.0–34.0)
MCHC: 34.5 g/dL (ref 30.0–36.0)
MCV: 104.3 fL — AB (ref 78.0–100.0)
MONO ABS: 2.7 10*3/uL — AB (ref 0.1–1.0)
Monocytes Relative: 15 %
Neutro Abs: 12.7 10*3/uL — ABNORMAL HIGH (ref 1.7–7.7)
Neutrophils Relative %: 70 %
PLATELETS: 75 10*3/uL — AB (ref 150–400)
RBC: 3.03 MIL/uL — ABNORMAL LOW (ref 4.22–5.81)
RDW: 15.7 % — ABNORMAL HIGH (ref 11.5–15.5)
WBC: 18.2 10*3/uL — ABNORMAL HIGH (ref 4.0–10.5)

## 2016-11-17 LAB — LIPASE, BLOOD: LIPASE: 36 U/L (ref 11–51)

## 2016-11-17 LAB — COMPREHENSIVE METABOLIC PANEL
ALT: 90 U/L — AB (ref 17–63)
AST: 261 U/L — ABNORMAL HIGH (ref 15–41)
Albumin: 1.6 g/dL — ABNORMAL LOW (ref 3.5–5.0)
Alkaline Phosphatase: 136 U/L — ABNORMAL HIGH (ref 38–126)
Anion gap: 8 (ref 5–15)
BILIRUBIN TOTAL: 14.2 mg/dL — AB (ref 0.3–1.2)
BUN: 37 mg/dL — ABNORMAL HIGH (ref 6–20)
CALCIUM: 8.7 mg/dL — AB (ref 8.9–10.3)
CO2: 23 mmol/L (ref 22–32)
CREATININE: 2.64 mg/dL — AB (ref 0.61–1.24)
Chloride: 94 mmol/L — ABNORMAL LOW (ref 101–111)
GFR, EST AFRICAN AMERICAN: 31 mL/min — AB (ref >60)
GFR, EST NON AFRICAN AMERICAN: 27 mL/min — AB (ref >60)
Glucose, Bld: 72 mg/dL (ref 65–99)
Potassium: 4.9 mmol/L (ref 3.5–5.1)
Sodium: 125 mmol/L — ABNORMAL LOW (ref 135–145)
Total Protein: 6.4 g/dL — ABNORMAL LOW (ref 6.5–8.1)

## 2016-11-17 LAB — POC OCCULT BLOOD, ED: Fecal Occult Bld: POSITIVE — AB

## 2016-11-17 LAB — AMMONIA: AMMONIA: 16 umol/L (ref 9–35)

## 2016-11-17 MED ORDER — ONDANSETRON HCL 4 MG/2ML IJ SOLN
4.0000 mg | Freq: Once | INTRAMUSCULAR | Status: AC
  Administered 2016-11-17: 4 mg via INTRAVENOUS
  Filled 2016-11-17: qty 2

## 2016-11-17 MED ORDER — IOPAMIDOL (ISOVUE-300) INJECTION 61%
INTRAVENOUS | Status: AC
  Filled 2016-11-17: qty 30

## 2016-11-17 MED ORDER — MORPHINE SULFATE (PF) 4 MG/ML IV SOLN
4.0000 mg | Freq: Once | INTRAVENOUS | Status: AC
  Administered 2016-11-17: 4 mg via INTRAVENOUS
  Filled 2016-11-17: qty 1

## 2016-11-17 MED ORDER — SODIUM CHLORIDE 0.9 % IV BOLUS (SEPSIS)
1000.0000 mL | Freq: Once | INTRAVENOUS | Status: AC
  Administered 2016-11-17: 1000 mL via INTRAVENOUS

## 2016-11-17 MED ORDER — IOPAMIDOL (ISOVUE-300) INJECTION 61%
30.0000 mL | Freq: Once | INTRAVENOUS | Status: AC | PRN
  Administered 2016-11-18: 30 mL via ORAL

## 2016-11-17 NOTE — ED Notes (Signed)
Patient transported to [facility] 

## 2016-11-17 NOTE — ED Triage Notes (Signed)
Pt reports having generalized abdominal pain yesterday and black stools x 2 days. Pt is a long distance truck driver. Family reports that they believe the pt has been sick for longer but hasn't told his family.

## 2016-11-17 NOTE — ED Provider Notes (Signed)
[facility] Provider Note   CSN: [id number] Arrival date & time: 11/22/2016  1935     History   Chief Complaint Chief Complaint  Patient presents with  . Abdominal Pain    HPI Jesus [name] is a 51 y.o. male.  Level 5 caveat for urgent need for intervention.  Abdominal pain and swelling for several days, getting worse.  Patient has a known history of hepatitis B and questionable cirrhosis.  He has also become jaundiced.  No fever, sweats, chills.      Past Medical History:  Diagnosis Date  . Hepatitis    hep B    Patient Active Problem List   Diagnosis Date Noted  . Hepatitis B 04/21/2015  . UGI bleed 04/07/2015  . Hematemesis 04/07/2015  . Elevated LFTs 04/07/2015  . Thrombocytopenia (HCC) 04/07/2015  . Macrocytic anemia 04/07/2015    Past Surgical History:  Procedure Laterality Date  . CHOLECYSTECTOMY N/A 04/12/2015   Procedure: LAPAROSCOPIC CHOLECYSTECTOMY;  Surgeon: Franky MachoMark Jenkins, MD;  Location: [facility] ORS;  Service: General;  Laterality: N/A;  . ESOPHAGOGASTRODUODENOSCOPY N/A 04/07/2015   Procedure: ESOPHAGOGASTRODUODENOSCOPY (EGD);  Surgeon: Malissa HippoNajeeb U Rehman, MD;  Location: [facility] ENDO SUITE;  Service: Endoscopy;  Laterality: N/A;  . None         Home Medications    Prior to Admission medications   Not on File    Family History Family History  Problem Relation Age of Onset  . Hypertension Brother     Social History Social History  Substance Use Topics  . Smoking status: Never Smoker  . Smokeless tobacco: Never Used  . Alcohol use No     Allergies   Patient has no known allergies.   Review of Systems Review of Systems  All other systems reviewed and are negative.    Physical Exam Updated Vital Signs BP (!) 86/50   Pulse 92   Temp 98.3 F (36.8 C) (Oral)   Resp 20   Ht 5\' 9"  (1.753 m)   Wt (!) 158.8 kg (350 lb)   SpO2 97%   BMI 51.69 kg/m   Physical Exam  Constitutional: He is oriented to person, place, and  time.  Overweight, jaundiced  HENT:  Head: Normocephalic and atraumatic.  Eyes: Conjunctivae are normal.  Neck: Neck supple.  Cardiovascular: Normal rate and regular rhythm.   Pulmonary/Chest: Effort normal and breath sounds normal.  Abdominal:  ascites  Musculoskeletal: Normal range of motion.  Neurological: He is alert and oriented to person, place, and time.  Skin: Skin is warm and dry.  Psychiatric: He has a normal mood and affect. His behavior is normal.  Nursing note and vitals reviewed.    ED Treatments / Results  Labs (all labs ordered are listed, but only abnormal results are displayed) Labs Reviewed  COMPREHENSIVE METABOLIC PANEL - Abnormal; Notable for the following:       Result Value   Sodium 125 (*)    Chloride 94 (*)    BUN 37 (*)    Creatinine, Ser 2.64 (*)    Calcium 8.7 (*)    Total Protein 6.4 (*)    Albumin 1.6 (*)    AST 261 (*)    ALT 90 (*)    Alkaline Phosphatase 136 (*)    Total Bilirubin 14.2 (*)    GFR calc non Af Amer 27 (*)    GFR calc Af Amer 31 (*)    All other components within normal limits  CBC WITH DIFFERENTIAL/PLATELET -  Abnormal; Notable for the following:    WBC 18.2 (*)    RBC 3.03 (*)    Hemoglobin 10.9 (*)    HCT 31.6 (*)    MCV 104.3 (*)    MCH 36.0 (*)    RDW 15.7 (*)    Platelets 75 (*)    Neutro Abs 12.7 (*)    Monocytes Absolute 2.7 (*)    All other components within normal limits  LIPASE, BLOOD  AMMONIA  CBC WITH DIFFERENTIAL/PLATELET  URINALYSIS, ROUTINE W REFLEX MICROSCOPIC    EKG  EKG Interpretation None       Radiology Dg Chest 2 View  Result Date: 11/22/2016 CLINICAL DATA:  Liver failure EXAM: CHEST  2 VIEW COMPARISON:  04/07/2015 FINDINGS: Low lung volumes. Small pleural effusions. Cardiomegaly with central vascular congestion and mild pulmonary edema. Mild right base opacity versus small infiltrate. IMPRESSION: 1. Low lung volumes 2. Small pleural effusions with right base atelectasis or small  infiltrate 3. Cardiomegaly with vascular congestion and mild pulmonary edema. Electronically Signed   By: Jasmine Pang M.D.   On: 11/22/2016 22:35    Procedures Procedures (including critical care time)  Medications Ordered in ED Medications  morphine 4 MG/ML injection 4 mg (4 mg Intravenous Given 11/22/2016 2058)  ondansetron (ZOFRAN) injection 4 mg (4 mg Intravenous Given 11/13/2016 2058)  sodium chloride 0.9 % bolus 1,000 mL (1,000 mLs Intravenous New Bag/Given 10/27/2016 2115)     Initial Impression / Assessment and Plan / ED Course  I have reviewed the triage vital signs and the nursing notes.  Pertinent labs & imaging results that were available during my care of the patient were reviewed by me and considered in my medical decision making (see chart for details).     History and physical most consistent with cirrhosis with associated ascites.  Liver functions are all elevated.  His blood pressure is slightly soft, but he is alert without diaphoresis, dyspnea, cp.  Discussed with Dr. Robb Matar  Final Clinical Impressions(s) / ED Diagnoses   Final diagnoses:  Cirrhosis of liver with ascites, unspecified hepatic cirrhosis type (HCC)  Elevated liver enzymes    New Prescriptions New Prescriptions   No medications on file     Donnetta Hutching, MD 10/24/2016 2318

## 2016-11-18 DIAGNOSIS — K7460 Unspecified cirrhosis of liver: Secondary | ICD-10-CM | POA: Diagnosis present

## 2016-11-18 DIAGNOSIS — E162 Hypoglycemia, unspecified: Secondary | ICD-10-CM | POA: Diagnosis present

## 2016-11-18 DIAGNOSIS — E871 Hypo-osmolality and hyponatremia: Secondary | ICD-10-CM | POA: Diagnosis present

## 2016-11-18 DIAGNOSIS — Z9049 Acquired absence of other specified parts of digestive tract: Secondary | ICD-10-CM | POA: Diagnosis not present

## 2016-11-18 DIAGNOSIS — R791 Abnormal coagulation profile: Secondary | ICD-10-CM | POA: Diagnosis present

## 2016-11-18 DIAGNOSIS — K767 Hepatorenal syndrome: Secondary | ICD-10-CM | POA: Diagnosis present

## 2016-11-18 DIAGNOSIS — R4182 Altered mental status, unspecified: Secondary | ICD-10-CM | POA: Diagnosis present

## 2016-11-18 DIAGNOSIS — Z66 Do not resuscitate: Secondary | ICD-10-CM | POA: Diagnosis present

## 2016-11-18 DIAGNOSIS — D696 Thrombocytopenia, unspecified: Secondary | ICD-10-CM | POA: Diagnosis present

## 2016-11-18 DIAGNOSIS — B181 Chronic viral hepatitis B without delta-agent: Secondary | ICD-10-CM | POA: Diagnosis present

## 2016-11-18 DIAGNOSIS — E8770 Fluid overload, unspecified: Secondary | ICD-10-CM | POA: Diagnosis present

## 2016-11-18 DIAGNOSIS — R188 Other ascites: Secondary | ICD-10-CM | POA: Diagnosis present

## 2016-11-18 DIAGNOSIS — D539 Nutritional anemia, unspecified: Secondary | ICD-10-CM | POA: Diagnosis present

## 2016-11-18 DIAGNOSIS — N170 Acute kidney failure with tubular necrosis: Secondary | ICD-10-CM | POA: Diagnosis present

## 2016-11-18 DIAGNOSIS — D72829 Elevated white blood cell count, unspecified: Secondary | ICD-10-CM | POA: Diagnosis present

## 2016-11-18 DIAGNOSIS — Z8249 Family history of ischemic heart disease and other diseases of the circulatory system: Secondary | ICD-10-CM | POA: Diagnosis not present

## 2016-11-18 DIAGNOSIS — R34 Anuria and oliguria: Secondary | ICD-10-CM | POA: Diagnosis present

## 2016-11-18 DIAGNOSIS — K7200 Acute and subacute hepatic failure without coma: Secondary | ICD-10-CM | POA: Diagnosis present

## 2016-11-18 DIAGNOSIS — R570 Cardiogenic shock: Secondary | ICD-10-CM | POA: Diagnosis not present

## 2016-11-18 DIAGNOSIS — Z9114 Patient's other noncompliance with medication regimen: Secondary | ICD-10-CM | POA: Diagnosis not present

## 2016-11-18 DIAGNOSIS — K254 Chronic or unspecified gastric ulcer with hemorrhage: Secondary | ICD-10-CM | POA: Diagnosis present

## 2016-11-18 LAB — MRSA PCR SCREENING: MRSA by PCR: NEGATIVE

## 2016-11-18 LAB — MAGNESIUM: Magnesium: 2.2 mg/dL (ref 1.7–2.4)

## 2016-11-18 LAB — POCT I-STAT 3, ART BLOOD GAS (G3+)
ACID-BASE DEFICIT: 5 mmol/L — AB (ref 0.0–2.0)
Bicarbonate: 20.6 mmol/L (ref 20.0–28.0)
O2 Saturation: 96 %
PH ART: 7.351 (ref 7.350–7.450)
TCO2: 22 mmol/L (ref 22–32)
pCO2 arterial: 37.2 mmHg (ref 32.0–48.0)
pO2, Arterial: 82 mmHg — ABNORMAL LOW (ref 83.0–108.0)

## 2016-11-18 LAB — BASIC METABOLIC PANEL
Anion gap: 11 (ref 5–15)
BUN: 38 mg/dL — AB (ref 6–20)
CALCIUM: 8.5 mg/dL — AB (ref 8.9–10.3)
CHLORIDE: 95 mmol/L — AB (ref 101–111)
CO2: 19 mmol/L — ABNORMAL LOW (ref 22–32)
CREATININE: 3.69 mg/dL — AB (ref 0.61–1.24)
GFR calc Af Amer: 21 mL/min — ABNORMAL LOW (ref >60)
GFR calc non Af Amer: 18 mL/min — ABNORMAL LOW (ref >60)
Glucose, Bld: 84 mg/dL (ref 65–99)
Potassium: 5.3 mmol/L — ABNORMAL HIGH (ref 3.5–5.1)
Sodium: 125 mmol/L — ABNORMAL LOW (ref 135–145)

## 2016-11-18 LAB — PROTIME-INR
INR: 8.28
Prothrombin Time: 68.3 seconds — ABNORMAL HIGH (ref 11.4–15.2)

## 2016-11-18 LAB — GLUCOSE, CAPILLARY
GLUCOSE-CAPILLARY: 18 mg/dL — AB (ref 65–99)
GLUCOSE-CAPILLARY: 50 mg/dL — AB (ref 65–99)
GLUCOSE-CAPILLARY: 86 mg/dL (ref 65–99)
GLUCOSE-CAPILLARY: 83 mg/dL (ref 65–99)
Glucose-Capillary: 60 mg/dL — ABNORMAL LOW (ref 65–99)
Glucose-Capillary: 72 mg/dL (ref 65–99)

## 2016-11-18 LAB — NA AND K (SODIUM & POTASSIUM), RAND UR
POTASSIUM UR: 38 mmol/L
Sodium, Ur: 18 mmol/L

## 2016-11-18 MED ORDER — OCTREOTIDE ACETATE 500 MCG/ML IJ SOLN
INTRAMUSCULAR | Status: AC
  Filled 2016-11-18: qty 1

## 2016-11-18 MED ORDER — PANTOPRAZOLE SODIUM 40 MG IV SOLR
40.0000 mg | Freq: Every day | INTRAVENOUS | Status: DC
  Administered 2016-11-18: 40 mg via INTRAVENOUS
  Filled 2016-11-18: qty 40

## 2016-11-18 MED ORDER — ALBUMIN HUMAN 25 % IV SOLN
50.0000 g | Freq: Once | INTRAVENOUS | Status: AC
  Administered 2016-11-18: 50 g via INTRAVENOUS
  Filled 2016-11-18: qty 200

## 2016-11-18 MED ORDER — ALBUMIN HUMAN 25 % IV SOLN
INTRAVENOUS | Status: AC
  Filled 2016-11-18: qty 100

## 2016-11-18 MED ORDER — ASPIRIN 300 MG RE SUPP: 300.0000 mg | RECTAL | Status: AC

## 2016-11-18 MED ORDER — ALBUMIN HUMAN 25 % IV SOLN
INTRAVENOUS | Status: AC
  Filled 2016-11-18: qty 50

## 2016-11-18 MED ORDER — OCTREOTIDE LOAD VIA INFUSION
100.0000 ug | Freq: Once | INTRAVENOUS | Status: AC
  Administered 2016-11-18: 100 ug via INTRAVENOUS
  Filled 2016-11-18: qty 50

## 2016-11-18 MED ORDER — DEXTROSE 50 % IV SOLN
50.0000 mL | Freq: Once | INTRAVENOUS | Status: AC
  Administered 2016-11-18: 50 mL via INTRAVENOUS

## 2016-11-18 MED ORDER — SODIUM CHLORIDE 0.9 % IV SOLN
Freq: Once | INTRAVENOUS | Status: AC
  Administered 2016-11-18: 09:00:00 via INTRAVENOUS

## 2016-11-18 MED ORDER — PANTOPRAZOLE SODIUM 40 MG IV SOLR
40.0000 mg | Freq: Once | INTRAVENOUS | Status: AC
  Administered 2016-11-18: 40 mg via INTRAVENOUS
  Filled 2016-11-18: qty 40

## 2016-11-18 MED ORDER — SODIUM CHLORIDE 0.9 % IV SOLN
50.0000 ug/h | INTRAVENOUS | Status: DC
  Administered 2016-11-18 – 2016-11-20 (×5): 50 ug/h via INTRAVENOUS
  Filled 2016-11-18 (×13): qty 1

## 2016-11-18 MED ORDER — PHENYLEPHRINE HCL 10 MG/ML IJ SOLN
0.0000 ug/min | INTRAMUSCULAR | Status: DC
  Administered 2016-11-18: 130 ug/min via INTRAVENOUS
  Administered 2016-11-18: 200 ug/min via INTRAVENOUS
  Filled 2016-11-18 (×5): qty 4

## 2016-11-18 MED ORDER — MIDODRINE HCL 5 MG PO TABS
10.0000 mg | ORAL_TABLET | Freq: Three times a day (TID) | ORAL | Status: DC
  Administered 2016-11-18: 10 mg via ORAL
  Filled 2016-11-18 (×10): qty 2

## 2016-11-18 MED ORDER — OCTREOTIDE ACETATE 100 MCG/ML IJ SOLN
INTRAMUSCULAR | Status: AC
  Filled 2016-11-18: qty 1

## 2016-11-18 MED ORDER — VITAMIN K1 10 MG/ML IJ SOLN
10.0000 mg | Freq: Once | INTRAVENOUS | Status: AC
  Administered 2016-11-18: 10 mg via INTRAVENOUS
  Filled 2016-11-18: qty 1

## 2016-11-18 MED ORDER — CHLORHEXIDINE GLUCONATE 0.12 % MT SOLN
15.0000 mL | Freq: Two times a day (BID) | OROMUCOSAL | Status: DC
  Administered 2016-11-19 – 2016-11-20 (×2): 15 mL via OROMUCOSAL
  Filled 2016-11-18: qty 15

## 2016-11-18 MED ORDER — ORAL CARE MOUTH RINSE
15.0000 mL | Freq: Two times a day (BID) | OROMUCOSAL | Status: DC
  Administered 2016-11-19 – 2016-11-20 (×3): 15 mL via OROMUCOSAL

## 2016-11-18 MED ORDER — ALBUMIN HUMAN 25 % IV SOLN
12.5000 g | Freq: Once | INTRAVENOUS | Status: AC
  Administered 2016-11-19: 12.5 g via INTRAVENOUS
  Filled 2016-11-18: qty 50

## 2016-11-18 MED ORDER — SODIUM CHLORIDE 0.9 % IV SOLN
250.0000 mL | INTRAVENOUS | Status: DC | PRN
  Administered 2016-11-18: 250 mL via INTRAVENOUS

## 2016-11-18 MED ORDER — DEXTROSE-NACL 5-0.9 % IV SOLN
INTRAVENOUS | Status: DC
  Administered 2016-11-18: 14:00:00 via INTRAVENOUS

## 2016-11-18 MED ORDER — DEXTROSE 5 % IV SOLN
2.0000 g | INTRAVENOUS | Status: DC
  Administered 2016-11-18 – 2016-11-20 (×3): 2 g via INTRAVENOUS
  Filled 2016-11-18 (×3): qty 2

## 2016-11-18 MED ORDER — SODIUM CHLORIDE 0.9 % IV BOLUS (SEPSIS)
1000.0000 mL | Freq: Once | INTRAVENOUS | Status: AC
  Administered 2016-11-18: 1000 mL via INTRAVENOUS

## 2016-11-18 MED ORDER — DEXTROSE 50 % IV SOLN
INTRAVENOUS | Status: AC
  Filled 2016-11-18: qty 100

## 2016-11-18 MED ORDER — PANTOPRAZOLE SODIUM 40 MG PO TBEC: 40.0000 mg | DELAYED_RELEASE_TABLET | Freq: Every day | ORAL | Status: DC

## 2016-11-18 MED ORDER — PHENYLEPHRINE HCL 10 MG/ML IJ SOLN
INTRAMUSCULAR | Status: AC
  Filled 2016-11-18: qty 1

## 2016-11-18 MED ORDER — SODIUM CHLORIDE 0.9 % IV SOLN: Freq: Once | INTRAVENOUS | Status: DC

## 2016-11-18 MED ORDER — ASPIRIN 81 MG PO CHEW: 324.0000 mg | CHEWABLE_TABLET | ORAL | Status: AC

## 2016-11-18 MED ORDER — SODIUM CHLORIDE 0.9 % IV SOLN
0.0000 ug/min | INTRAVENOUS | Status: DC
  Administered 2016-11-18: 20 ug/min via INTRAVENOUS
  Filled 2016-11-18: qty 1

## 2016-11-18 NOTE — ED Notes (Signed)
Called [facility] with bed assignment(2M07C)

## 2016-11-18 NOTE — ED Notes (Signed)
Pt stated that we could contact Jesus [name] (daughter [phone] and Jesus [name] (sister in-law [phone]) about his change in care or questions.

## 2016-11-18 NOTE — Plan of Care (Signed)
The patient has been having hypotension consistently. His mental status seems to be worsening as he is drowsy at this time per ED and [facility] nursing staff. ICU was consulted. Dr. Darrick Pennaeterding recommended Neo-Synephrine infusion and admission to the ICU. They will let us know later today when they are ready to accept him to the ICU once more CCM personnel is available later today. [facility] will return once PCCM notifies us that the patient can be transported.   Sanda Kleinavid Ortiz, MD

## 2016-11-18 NOTE — ED Notes (Signed)
Bladder scan done.  No urine noted in bladder.  Abdomen obese with wall thickening.

## 2016-11-18 NOTE — H&P (Signed)
History and Physical    Jesus [name] ZOX:[id number] DOB: November 28, 1965 DOA: 11-30-2016  PCP: Avon Gully, MD   Patient coming from: Home.  I have personally briefly reviewed patient's old medical records in [facility]  Chief Complaint: Abdominal pain and melena x 2 days.  HPI: Jesus [name] is a 51 y.o. male with medical history significant of chronic hepatitis B, liver cirrhosis, history of UGI bleed who is coming to the emergency department with complaints of progressively worse of abdominal pain since last week and melena for the past 2 days associated with dyspnea, progressively worse edema of all extremities, ascites, icterus, decreased appetite and decreased urine volume. He denies fever, but complains of chills and fatigue. No headache, sore throat, productive cough, chest pain, palpitations, dizziness or diaphoresis. No nausea, emesis, diarrhea, constipation or hematochezia. Denies dysuria, frequency, hematuria, but complains of dark urine and decreased urine volume over the past 2 days.    ED Course: His initial vital signs temperature 98.64F, pulse 90, blood pressure 107/91 mmHg, respirations 20 and O2 sat 98% on room air. His systolic blood pressure subsequently decreased to the 80s and 90s after he was given morphine 4 mg IVP 1.  His WBC 18.2 with 70% neutrophils, hemoglobin 10.9 g/dL and platelets 75. Ammonia level was 16, sodium 125, potassium 4.9, chloride 94 and bicarbonate 23 mmol/L. BUN was 37, creatinine 2.64, calcium 8.7 and glucose 72 mg/dL. His renal function was normal in 2017. Total protein 6.4and albumin 1.6 g/dL. AST 201, ALT 90 and alkaline phosphatase 136 units. Total bilirubin was 14.2 mg/dL.  I started the patient octreotide infusion, pantoprazole IV, midodrine and albumin. Transfer to [facility] due to the acuity of the case and no IR availability at [facility] over the weekend.  Imaging: 2 view chest radiograph shows low volumes, small pleural effusions with right base  atelectasis or small infiltrate, cardiomegaly with vascular congestion and mild pulmonary edema. CT abdomen/pelvis without contrast shows mild wall thickening along the ascending colon which could reflect a mild infectious or inflammatory process. Also jejunal wall thickening was seen. Moderate volume ascites, hepatic cirrhosis and anasarca among other findings. Please see images and full radiology reports for further detail.  Review of Systems: As per HPI otherwise 10 point review of systems negative.   Past Medical History:  Diagnosis Date  . Hepatitis    hep B    Past Surgical History:  Procedure Laterality Date  . CHOLECYSTECTOMY N/A 04/12/2015   Procedure: LAPAROSCOPIC CHOLECYSTECTOMY;  Surgeon: Franky Macho, MD;  Location: [facility] ORS;  Service: General;  Laterality: N/A;  . ESOPHAGOGASTRODUODENOSCOPY N/A 04/07/2015   Procedure: ESOPHAGOGASTRODUODENOSCOPY (EGD);  Surgeon: Malissa Hippo, MD;  Location: [facility] ENDO SUITE;  Service: Endoscopy;  Laterality: N/A;  . None       reports that he has never smoked. He has never used smokeless tobacco. He reports that he does not drink alcohol or use drugs.  No Known Allergies  Family History  Problem Relation Age of Onset  . Hypertension Brother     Prior to Admission medications   Not on File    Physical Exam: Vitals:   Nov 30, 2016 2330 11/18/16 0030 11/18/16 0130 11/18/16 0134  BP: (!) 81/33 (!) 82/53 (!) 91/43 (!) 91/43  Pulse: 75   80  Resp:    17  Temp:    97.7 F (36.5 C)  TempSrc:    Oral  SpO2: 97%   96%  Weight:      Height:  Constitutional: NAD, calm, comfortable Eyes: PERRL, icteric sclerae, lids and conjunctivae normal ENMT: Mucous membranes are moist. Posterior pharynx clear of any exudate or lesions.  Neck: normal, supple, no masses, no thyromegaly Respiratory: decreased breath sounds on bases, otherwise clear to auscultation bilaterally, no wheezing, no crackles. Normal respiratory effort. No accessory muscle  use.  Cardiovascular: Regular rate and rhythm, no murmurs / rubs / gallops. Anasarca, 4+ lower extremities edema, stage II lymphedema. 2+ pedal pulses. No carotid bruits.  Abdomen: Obese, severely distended, mild diffuse tenderness, no guarding or rebound, no masses palpated. Bowel sounds positive.  Musculoskeletal: no clubbing / cyanosis. No joint deformity upper and lower extremities. Good ROM, no contractures. Normal muscle tone.  Skin: Icteric, multiple ecchymosis areas on extremities. Neurologic: CN 2-12 grossly intact. Sensation intact, DTR normal. Strength 5/5 in all 4.  Psychiatric: Normal judgment and insight. Alert and oriented x 4. Normal mood.    Labs on Admission: I have personally reviewed following labs and imaging studies  CBC:  Recent Labs Lab November 29, 2016 2110  WBC 18.2*  NEUTROABS 12.7*  HGB 10.9*  HCT 31.6*  MCV 104.3*  PLT 75*   Basic Metabolic Panel:  Recent Labs Lab November 29, 2016 2024  NA 125*  K 4.9  CL 94*  CO2 23  GLUCOSE 72  BUN 37*  CREATININE 2.64*  CALCIUM 8.7*   GFR: Estimated Creatinine Clearance: 50.1 mL/min (A) (by C-G formula based on SCr of 2.64 mg/dL (H)). Liver Function Tests:  Recent Labs Lab November 29, 2016 2024  AST 261*  ALT 90*  ALKPHOS 136*  BILITOT 14.2*  PROT 6.4*  ALBUMIN 1.6*    Recent Labs Lab 11/29/16 2024  LIPASE 36    Recent Labs Lab 2016-11-29 2024  AMMONIA 16      04/07/2015 07:55 04/10/2015 05:30 04/21/2015 09:10  Hepatitis B Surface Ag  Positive (A)    Hep B Core Ab, IgM  Negative    Hep B E Ab   Negative   Hep B E Ag   Positive (A)   NGI HBV Endpoint Dilution   450,000,000   Hepatitis B DNA   Comment [phone] (H)  Hepatitis B DNA (Calc)   Comment 8.21 (H)  Test Information   Comment     Coagulation Profile: No results for input(s): INR, PROTIME in the last 168 hours. Cardiac Enzymes: No results for input(s): CKTOTAL, CKMB, CKMBINDEX, TROPONINI in the last 168 hours. BNP (last 3 results) No results  for input(s): PROBNP in the last 8760 hours. HbA1C: No results for input(s): HGBA1C in the last 72 hours. CBG: No results for input(s): GLUCAP in the last 168 hours. Lipid Profile: No results for input(s): CHOL, HDL, LDLCALC, TRIG, CHOLHDL, LDLDIRECT in the last 72 hours. Thyroid Function Tests: No results for input(s): TSH, T4TOTAL, FREET4, T3FREE, THYROIDAB in the last 72 hours. Anemia Panel: No results for input(s): VITAMINB12, FOLATE, FERRITIN, TIBC, IRON, RETICCTPCT in the last 72 hours. Urine analysis:    Component Value Date/Time   COLORURINE YELLOW 04/07/2015 0818   APPEARANCEUR CLEAR 04/07/2015 0818   LABSPEC 1.020 04/07/2015 0818   PHURINE 5.5 04/07/2015 0818   GLUCOSEU NEGATIVE 04/07/2015 0818   HGBUR NEGATIVE 04/07/2015 0818   BILIRUBINUR MODERATE (A) 04/07/2015 0818   KETONESUR TRACE (A) 04/07/2015 0818   PROTEINUR NEGATIVE 04/07/2015 0818   NITRITE NEGATIVE 04/07/2015 0818   LEUKOCYTESUR NEGATIVE 04/07/2015 0818    Radiological Exams on Admission: Ct Abdomen Pelvis Wo Contrast  Result Date: 11/18/2016 CLINICAL DATA:  Acute onset  of generalized abdominal pain and black stools. Initial encounter. EXAM: CT ABDOMEN AND PELVIS WITHOUT CONTRAST TECHNIQUE: Multidetector CT imaging of the abdomen and pelvis was performed following the standard protocol without IV contrast. COMPARISON:  Right upper quadrant ultrasound performed 04/26/2015 FINDINGS: Lower chest: Trace right-sided pleural fluid is noted, with associated atelectasis. Hepatobiliary: The nodular contour of the liver is compatible with hepatic cirrhosis. The patient is status post cholecystectomy, with clips noted at the gallbladder fossa. The common bile duct is grossly unremarkable in appearance. Pancreas: The pancreas is within normal limits. Spleen: The spleen is unremarkable in appearance. Adrenals/Urinary Tract: The adrenal glands are unremarkable in appearance. The kidneys are grossly unremarkable. There is no  evidence of hydronephrosis. No renal or ureteral stones are identified. No perinephric stranding is appreciated. Stomach/Bowel: Mild wall thickening is noted along the ascending colon, which could reflect a mild infectious or inflammatory process. There is also jejunal wall thickening, of uncertain significance. Remaining small bowel is grossly unremarkable. The stomach is grossly unremarkable in appearance. Vascular/Lymphatic: The abdominal aorta is unremarkable in appearance. The inferior vena cava is grossly unremarkable. No retroperitoneal lymphadenopathy is seen. No pelvic sidewall lymphadenopathy is identified. Reproductive: The bladder is decompressed and not well assessed. The prostate is normal in size. Other: Moderate ascites is noted within the abdomen and pelvis. Soft tissue edema is noted along the abdominal and pelvic wall, concerning for anasarca. Musculoskeletal: No acute osseous abnormalities are identified. 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 The visualized musculature is unremarkable in appearance. IMPRESSION: 1. Mild wall thickening along the ascending colon, which could reflect a mild infectious or inflammatory process. Jejunal wall thickening also noted. 2. Moderate volume ascites within the abdomen and pelvis. 3. Findings of hepatic cirrhosis. 4. Trace right-sided pleural fluid, with associated atelectasis. 5. Soft tissue edema along the abdominal and pelvic wall, concerning for anasarca. 6. Chronic bilateral pars defects at L5, without evidence of anterolisthesis. Electronically Signed   By: Roanna RaiderJeffery  Chang M.D.   On: 11/18/2016 00:59   Dg Chest 2 View  Result Date: April 13, 2016 CLINICAL DATA:  Liver failure EXAM: CHEST  2 VIEW COMPARISON:  04/07/2015 FINDINGS: Low lung volumes. Small pleural effusions. Cardiomegaly with central vascular congestion and mild pulmonary edema. Mild right base opacity versus small infiltrate. IMPRESSION: 1. Low  lung volumes 2. Small pleural effusions with right base atelectasis or small infiltrate 3. Cardiomegaly with vascular congestion and mild pulmonary edema. Electronically Signed   By: Jasmine PangKim  Fujinaga M.D.   On: April 13, 2016 22:35    EKG: Independently reviewed.   Assessment/Plan Principal Problem:   Acute and subacute hepatic failure without coma   Hepatorenal syndrome (HCC) No INR available, but if a 1.5 INR measurement is assigned: MELD score 2016 would be 34 with a 52.6 % mortality in 90 days. His MELDNa/MELD-Na Score would be 65-66% mortality in 90 days. Gravity and prognosis discussed with the patient and his daughter. [facility]. Will give albumin 100 g loading dose, then 25 g every 6 hours. Continue midodrine 10 mg by mouth 3 times a day. Titrate up midodrine as clinically possible. Octreotide loading dose and infusion started. Monitor intake and output. Will need IR evaluation for paracentesis later today. Will need GI/hepatology consult later today. Consider palliative care medicine consult.  Active Problems:   UGI bleed Not actively bleeding at this time, but positive fecal occult blood. Keep nothing by mouth. Continue Protonix 40 mg IVP every 12  hours. Continue octreotide infusion. Monitor hematocrit and hemoglobin. Will need GI evaluation later today.    Thrombocytopenia (HCC) Secondary to liver failure. Monitor platelets.    Macrocytic anemia Monitor hematocrit and hemoglobin. Check B12 and folic acid level.    Hyponatremia Secondary to volume overload. Continue treatment as above. Monitor sodium level.    Leukocytosis The patient has had chills at home, but no fever. Still waiting for urinalysis results. However, Will start Rocephin 2 g IV PB every 24 hours to cover for SBP.     DVT prophylaxis: SCDs. Code Status: Full code. Family Communication: His daughter was present in the ED room. Disposition Plan: Admit to  [facility] for hepatorenal syndrome treatment. Consults called:  Admission status: Inpatient/SDU   Bobette Mo MD [facility] Pager [phone].  If 7PM-7AM, please contact night-coverage [email or web address] Password [email or web address]  11/18/2016, 2:24 AM

## 2016-11-18 NOTE — ED Notes (Signed)
[facility] called to get report.  

## 2016-11-18 NOTE — Consult Note (Signed)
PULMONARY / CRITICAL CARE MEDICINE   Name: Jesus [name] MRN: [id number] DOB: 09/05/1965    ADMISSION DATE:  2016/11/30 CONSULTATION DATE:  11/18/2016  REFERRING MD:  D. Robb Matar  CHIEF COMPLAINT:  Management of hepatorenal syndrome  HISTORY OF PRESENT ILLNESS:   Hx is per the H&P as the patient is currently confused with POC glu 18. This 51 y.o. B M is accepted in transfer from [facility] for management of hepatorenal syndrome. The patient, with  a hx chronic hepatitis B, liver cirrhosis and UGI bleed, presented to [facility] yesterday with c/o of 1 week of progressively worsening abdominal pain. He reportedly denied fever, N, V, hematechezia. He was afebrile but with a WBC of 18.2 he was initiated on Rocephin for empiric coverage of SBP. LFTs were pertinent for increased AST and bili of 14.2; ammonia 16 and albumin 1.6. Abdominal CT showed small effusion right base with atelectasis, moderate ascites with edema in the abdominal wall and thickening of the bowel wall. With systoloic BPS overnight in the 80-90's, he was started on Neosynephrine.  PAST MEDICAL HISTORY :  He  has a past medical history of Hepatitis.  PAST SURGICAL HISTORY: He  has a past surgical history that includes None; Esophagogastroduodenoscopy (N/A, 04/07/2015); and Cholecystectomy (N/A, 04/12/2015).  No Known Allergies  No current facility-administered medications on file prior to encounter.    No current outpatient prescriptions on file prior to encounter.    FAMILY HISTORY:  His indicated that the status of his brother is unknown.    SOCIAL HISTORY: He  reports that he has never smoked. He has never used smokeless tobacco. He reports that he does not drink alcohol or use drugs.  REVIEW OF SYSTEMS:   No able to obtain secondary to patient confusion  SUBJECTIVE:  Voicing no c/o  VITAL SIGNS: BP (!) 95/29   Pulse 77   Temp (!) 97.1 F (36.2 C) (Oral)   Resp 16   Ht 5\' 9"  (1.753 m)   Wt (!) 158.8 kg (350  lb)   SpO2 99%   BMI 51.69 kg/m   HEMODYNAMICS:  Hemodyn stable on Neosyn  VENTILATOR SETTINGS:  Breathing spontaneously on RA with sats 100%  INTAKE / OUTPUT: I/O last 3 completed shifts: In: 1000 [IV Piggyback:1000] Out: -   PHYSICAL EXAMINATION: General:  Confused Neuro:  Follows commands when stimulated HEENT:  [place]/AT PERRL EOMI NT-benign Cardiovascular:  Regular rhythm; no murmurs Lungs:  Slightly diminished BS but clear Abdomen:  Anasarca. NT. Unable to assess for organomegaly Musculoskeletal:  No obvious joint defomities Skin:  4+ edema extremities and torso  LABS:  BMET  Recent Labs Lab 11-30-16 2024  NA 125*  K 4.9  CL 94*  CO2 23  BUN 37*  CREATININE 2.64*  GLUCOSE 72    Electrolytes  Recent Labs Lab 11-30-16 2024  CALCIUM 8.7*    CBC  Recent Labs Lab 11/30/2016 2110  WBC 18.2*  HGB 10.9*  HCT 31.6*  PLT 75*    Coag's No results for input(s): APTT, INR in the last 168 hours.  Sepsis Markers No results for input(s): LATICACIDVEN, PROCALCITON, O2SATVEN in the last 168 hours.  ABG No results for input(s): PHART, PCO2ART, PO2ART in the last 168 hours.  Liver Enzymes  Recent Labs Lab November 30, 2016 2024  AST 261*  ALT 90*  ALKPHOS 136*  BILITOT 14.2*  ALBUMIN 1.6*    Cardiac Enzymes No results for input(s): TROPONINI, PROBNP in the last 168 hours.  Glucose  Recent Labs Lab 11/18/16 1223 11/18/16 1225  GLUCAP 21* 18*    Imaging Ct Abdomen Pelvis Wo Contrast  Result Date: 11/18/2016 CLINICAL DATA:  Acute onset of generalized abdominal pain and black stools. Initial encounter. EXAM: CT ABDOMEN AND PELVIS WITHOUT CONTRAST TECHNIQUE: Multidetector CT imaging of the abdomen and pelvis was performed following the standard protocol without IV contrast. COMPARISON:  Right upper quadrant ultrasound performed 04/26/2015 FINDINGS: Lower chest: Trace right-sided pleural fluid is noted, with associated atelectasis. Hepatobiliary: The  nodular contour of the liver is compatible with hepatic cirrhosis. The patient is status post cholecystectomy, with clips noted at the gallbladder fossa. The common bile duct is grossly unremarkable in appearance. Pancreas: The pancreas is within normal limits. Spleen: The spleen is unremarkable in appearance. Adrenals/Urinary Tract: The adrenal glands are unremarkable in appearance. The kidneys are grossly unremarkable. There is no evidence of hydronephrosis. No renal or ureteral stones are identified. No perinephric stranding is appreciated. Stomach/Bowel: Mild wall thickening is noted along the ascending colon, which could reflect a mild infectious or inflammatory process. There is also jejunal wall thickening, of uncertain significance. Remaining small bowel is grossly unremarkable. The stomach is grossly unremarkable in appearance. Vascular/Lymphatic: The abdominal aorta is unremarkable in appearance. The inferior vena cava is grossly unremarkable. No retroperitoneal lymphadenopathy is seen. No pelvic sidewall lymphadenopathy is identified. Reproductive: The bladder is decompressed and not well assessed. The prostate is normal in size. Other: Moderate ascites is noted within the abdomen and pelvis. Soft tissue edema is noted along the abdominal and pelvic wall, concerning for anasarca. Musculoskeletal: No acute osseous abnormalities are identified. 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 The visualized musculature is unremarkable in appearance. IMPRESSION: 1. Mild wall thickening along the ascending colon, which could reflect a mild infectious or inflammatory process. Jejunal wall thickening also noted. 2. Moderate volume ascites within the abdomen and pelvis. 3. Findings of hepatic cirrhosis. 4. Trace right-sided pleural fluid, with associated atelectasis. 5. Soft tissue edema along the abdominal and pelvic wall, concerning for anasarca. 6. Chronic bilateral pars  defects at L5, without evidence of anterolisthesis. Electronically Signed   By: Roanna RaiderJeffery  Chang M.D.   On: 11/18/2016 00:59   Dg Chest 2 View  Result Date: Jul 25, 2016 CLINICAL DATA:  Liver failure EXAM: CHEST  2 VIEW COMPARISON:  04/07/2015 FINDINGS: Low lung volumes. Small pleural effusions. Cardiomegaly with central vascular congestion and mild pulmonary edema. Mild right base opacity versus small infiltrate. IMPRESSION: 1. Low lung volumes 2. Small pleural effusions with right base atelectasis or small infiltrate 3. Cardiomegaly with vascular congestion and mild pulmonary edema. Electronically Signed   By: Jasmine PangKim  Fujinaga M.D.   On: Jul 25, 2016 22:35     STUDIES:  Abd CT as above  CULTURES: None  ANTIBIOTICS: Rocephin empiric for SBP  SIGNIFICANT EVENTS: None  LINES/TUBES: Periph IV  DISCUSSION: 51 y.o. 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 Initial management steps are appropriate. Will arrange for IR-performed paracentesis. Will withhold diuretics until more hemodynamically stable off pressors.  ASSESSMENT / PLAN:  PULMONARY A: No acute pulmonary process per chest imaging, but with altered MS will assess ventilatory status. P:   Check ABGs  CARDIOVASCULAR A:  Hemodynamic instability likely related to intravasc vol depletion from HRS.  P:  Has received 100 gm albumin but may NS bolus to see if we can titrate off the neo  RENAL A:   Azotemia likely secondary to HRS or intravasc vol depletion P:  Will check urine lytes  GASTROINTESTINAL A:   On octreotide, midrodrine and PPI P:   Continue and schedule paracentesis  HEMATOLOGIC A:   Anemia and TCP, secondary to liver dz P:  Follow  INFECTIOUS A:   R/o SBP P:   Paracentesis. Empiric cetriaxole  ENDOCRINE A:   Hypoglycemia. Has received D50W x2   P:   Change IV to D5NS  NEUROLOGIC A:   Altered MS P:   R/o metabolic causes before considering CT    Pulmonary and Critical Care  Medicine [facility] Pager: [phone]  11/18/2016, 12:53 PM

## 2016-11-18 NOTE — Progress Notes (Signed)
pts CBG was 21 on initial stick, then 18 on subsequent check on opposite site. RN notified of both findings.

## 2016-11-18 NOTE — Progress Notes (Signed)
eLink Physician-Brief Progress Note Patient Name: Jesus ManRay [name] DOB: 1965-12-24 MRN: [id number]   Date of Service  11/18/2016  HPI/Events of Note  Multiple issues: 1. Oliguria - Creatinine = 3.69 and K+ = 5.3 and 2. INR = 8.28. Both likely secondary liver disease and +/- HRS. BMP from 2:37 PM.  eICU Interventions  Will order: 1. 25% Albumin 12.5 gm IV now.  2. Repeat BMP. 3. Vitamin K 10 mg IV now.  4. Transfuse FFP 3 units now.  5. PT/INR in AM.        [name],[name] 11/18/2016, 9:23 PM

## 2016-11-18 NOTE — ED Notes (Signed)
Report given to Amina, RN

## 2016-11-18 NOTE — Progress Notes (Signed)
Pts CBG read 21. Dr. Lonell FaceKrim informed. Repeat CBG done and was 18. Verbal order from Dr. Lonell FaceKrim to given amp of D50 IV

## 2016-11-19 ENCOUNTER — Inpatient Hospital Stay (HOSPITAL_COMMUNITY): Payer: Medicaid Other

## 2016-11-19 DIAGNOSIS — K7200 Acute and subacute hepatic failure without coma: Principal | ICD-10-CM

## 2016-11-19 DIAGNOSIS — K767 Hepatorenal syndrome: Secondary | ICD-10-CM

## 2016-11-19 LAB — BASIC METABOLIC PANEL
Anion gap: 13 (ref 5–15)
Anion gap: 13 (ref 5–15)
BUN: 37 mg/dL — AB (ref 6–20)
BUN: 37 mg/dL — AB (ref 6–20)
CHLORIDE: 95 mmol/L — AB (ref 101–111)
CHLORIDE: 97 mmol/L — AB (ref 101–111)
CO2: 17 mmol/L — AB (ref 22–32)
CO2: 15 mmol/L — AB (ref 22–32)
Calcium: 8.4 mg/dL — ABNORMAL LOW (ref 8.9–10.3)
Calcium: 8.4 mg/dL — ABNORMAL LOW (ref 8.9–10.3)
Creatinine, Ser: 4.16 mg/dL — ABNORMAL HIGH (ref 0.61–1.24)
Creatinine, Ser: 4.45 mg/dL — ABNORMAL HIGH (ref 0.61–1.24)
GFR calc Af Amer: 16 mL/min — ABNORMAL LOW (ref >60)
GFR calc Af Amer: 18 mL/min — ABNORMAL LOW (ref >60)
GFR calc non Af Amer: 14 mL/min — ABNORMAL LOW (ref >60)
GFR calc non Af Amer: 15 mL/min — ABNORMAL LOW (ref >60)
GLUCOSE: 88 mg/dL (ref 65–99)
Glucose, Bld: 64 mg/dL — ABNORMAL LOW (ref 65–99)
POTASSIUM: 5 mmol/L (ref 3.5–5.1)
POTASSIUM: 6.2 mmol/L — AB (ref 3.5–5.1)
SODIUM: 125 mmol/L — AB (ref 135–145)
SODIUM: 125 mmol/L — AB (ref 135–145)

## 2016-11-19 LAB — GLUCOSE, CAPILLARY
GLUCOSE-CAPILLARY: 210 mg/dL — AB (ref 65–99)
GLUCOSE-CAPILLARY: 115 mg/dL — AB (ref 65–99)
Glucose-Capillary: 75 mg/dL (ref 65–99)
Glucose-Capillary: 89 mg/dL (ref 65–99)
Glucose-Capillary: 179 mg/dL — ABNORMAL HIGH (ref 65–99)
Glucose-Capillary: 111 mg/dL — ABNORMAL HIGH (ref 65–99)

## 2016-11-19 LAB — CORTISOL-AM, BLOOD: Cortisol - AM: 19 ug/dL (ref 6.7–22.6)

## 2016-11-19 LAB — PROTIME-INR
INR: 9.6 — AB
INR: 5.21 — AB
Prothrombin Time: 76.6 seconds — ABNORMAL HIGH (ref 11.4–15.2)
Prothrombin Time: 47.5 seconds — ABNORMAL HIGH (ref 11.4–15.2)

## 2016-11-19 LAB — CBC
HEMATOCRIT: 23.3 % — AB (ref 39.0–52.0)
HEMOGLOBIN: 7.9 g/dL — AB (ref 13.0–17.0)
MCH: 36.2 pg — AB (ref 26.0–34.0)
MCHC: 33.9 g/dL (ref 30.0–36.0)
MCV: 106.9 fL — AB (ref 78.0–100.0)
Platelets: 61 10*3/uL — ABNORMAL LOW (ref 150–400)
RBC: 2.18 MIL/uL — AB (ref 4.22–5.81)
RDW: 16.2 % — ABNORMAL HIGH (ref 11.5–15.5)
WBC: 14.6 10*3/uL — ABNORMAL HIGH (ref 4.0–10.5)

## 2016-11-19 LAB — PROCALCITONIN: PROCALCITONIN: 1.4 ng/mL

## 2016-11-19 LAB — ABO/RH: ABO/RH(D): O POS

## 2016-11-19 LAB — ACETAMINOPHEN LEVEL: Acetaminophen (Tylenol), Serum: <10 ug/mL — ABNORMAL LOW (ref 10–30)

## 2016-11-19 LAB — APTT: APTT: 71 s — AB (ref 24–36)

## 2016-11-19 LAB — LACTIC ACID, PLASMA: LACTIC ACID, VENOUS: 4 mmol/L — AB (ref 0.5–1.9)

## 2016-11-19 LAB — PHOSPHORUS: Phosphorus: 6.2 mg/dL — ABNORMAL HIGH (ref 2.5–4.6)

## 2016-11-19 LAB — SALICYLATE LEVEL: Salicylate Lvl: 9 mg/dL (ref 2.8–30.0)

## 2016-11-19 LAB — BRAIN NATRIURETIC PEPTIDE: B Natriuretic Peptide: 377.1 pg/mL — ABNORMAL HIGH (ref 0.0–100.0)

## 2016-11-19 LAB — MAGNESIUM: MAGNESIUM: 2 mg/dL (ref 1.7–2.4)

## 2016-11-19 MED ORDER — RIFAXIMIN 200 MG PO TABS
400.0000 mg | ORAL_TABLET | Freq: Three times a day (TID) | ORAL | Status: DC
  Filled 2016-11-19 (×5): qty 2

## 2016-11-19 MED ORDER — PANTOPRAZOLE SODIUM 40 MG IV SOLR
40.0000 mg | Freq: Two times a day (BID) | INTRAVENOUS | Status: DC
  Administered 2016-11-19 – 2016-11-20 (×2): 40 mg via INTRAVENOUS
  Filled 2016-11-19 (×4): qty 40

## 2016-11-19 MED ORDER — VASOPRESSIN 20 UNIT/ML IV SOLN
0.0300 [IU]/min | INTRAVENOUS | Status: DC
  Administered 2016-11-19 – 2016-11-20 (×2): 0.03 [IU]/min via INTRAVENOUS
  Filled 2016-11-19 (×2): qty 2

## 2016-11-19 MED ORDER — ONDANSETRON HCL 4 MG/2ML IJ SOLN: 4.0000 mg | Freq: Four times a day (QID) | INTRAMUSCULAR | Status: DC | PRN

## 2016-11-19 MED ORDER — DEXTROSE-NACL 5-0.45 % IV SOLN
INTRAVENOUS | Status: DC
  Administered 2016-11-19 – 2016-11-20 (×2): via INTRAVENOUS

## 2016-11-19 MED ORDER — DEXTROSE 10 % IV SOLN
INTRAVENOUS | Status: DC
  Administered 2016-11-19: 08:00:00 via INTRAVENOUS
  Administered 2016-11-19: 50 mL/h via INTRAVENOUS

## 2016-11-19 MED ORDER — LACTULOSE 10 GM/15ML PO SOLN
30.0000 g | Freq: Three times a day (TID) | ORAL | Status: DC
  Filled 2016-11-19 (×2): qty 45

## 2016-11-19 MED ORDER — LACTULOSE ENEMA
300.0000 mL | Freq: Three times a day (TID) | ORAL | Status: DC
  Administered 2016-11-19 – 2016-11-20 (×3): 300 mL via RECTAL
  Filled 2016-11-19 (×4): qty 300

## 2016-11-19 MED ORDER — SODIUM CHLORIDE 0.9 % IV SOLN
0.0000 ug/min | INTRAVENOUS | Status: DC
  Administered 2016-11-19: 100 ug/min via INTRAVENOUS
  Filled 2016-11-19 (×4): qty 4

## 2016-11-19 MED ORDER — SODIUM POLYSTYRENE SULFONATE 15 GM/60ML PO SUSP
30.0000 g | Freq: Once | ORAL | Status: AC
  Administered 2016-11-19: 30 g via RECTAL
  Filled 2016-11-19: qty 120

## 2016-11-19 MED ORDER — DEXTROSE 50 % IV SOLN
1.0000 | Freq: Once | INTRAVENOUS | Status: AC
  Administered 2016-11-19: 50 mL via INTRAVENOUS
  Filled 2016-11-19: qty 50

## 2016-11-19 MED ORDER — ALBUMIN HUMAN 25 % IV SOLN
25.0000 g | Freq: Four times a day (QID) | INTRAVENOUS | Status: DC
  Administered 2016-11-19 – 2016-11-20 (×5): 25 g via INTRAVENOUS
  Filled 2016-11-19: qty 100
  Filled 2016-11-19: qty 50
  Filled 2016-11-19 (×2): qty 100
  Filled 2016-11-19: qty 50
  Filled 2016-11-19: qty 150
  Filled 2016-11-19 (×2): qty 100

## 2016-11-19 MED ORDER — NOREPINEPHRINE BITARTRATE 1 MG/ML IV SOLN
0.0000 ug/min | INTRAVENOUS | Status: DC
  Administered 2016-11-19: 20 ug/min via INTRAVENOUS
  Filled 2016-11-19: qty 4

## 2016-11-19 MED ORDER — SODIUM CHLORIDE 0.9 % IV SOLN
0.0000 ug/min | INTRAVENOUS | Status: DC
  Administered 2016-11-19: 400 ug/min via INTRAVENOUS
  Administered 2016-11-19: 100 ug/min via INTRAVENOUS
  Administered 2016-11-19: 400 ug/min via INTRAVENOUS
  Administered 2016-11-19: 300 ug/min via INTRAVENOUS
  Filled 2016-11-19 (×4): qty 1

## 2016-11-19 MED ORDER — NOREPINEPHRINE BITARTRATE 1 MG/ML IV SOLN
0.0000 ug/min | INTRAVENOUS | Status: DC
  Administered 2016-11-19 – 2016-11-20 (×5): 40 ug/min via INTRAVENOUS
  Filled 2016-11-19 (×6): qty 16

## 2016-11-19 MED ORDER — LACTULOSE 10 GM/15ML PO SOLN
30.0000 g | Freq: Three times a day (TID) | ORAL | Status: DC
  Filled 2016-11-19: qty 45

## 2016-11-19 MED ORDER — LIDOCAINE HCL (PF) 1 % IJ SOLN
INTRAMUSCULAR | Status: AC
  Filled 2016-11-19: qty 30

## 2016-11-19 MED ORDER — INSULIN ASPART 100 UNIT/ML IV SOLN
10.0000 [IU] | Freq: Once | INTRAVENOUS | Status: AC
  Administered 2016-11-19: 10 [IU] via INTRAVENOUS

## 2016-11-19 MED ORDER — DEXTROSE 5 % IV SOLN
INTRAVENOUS | Status: DC
  Administered 2016-11-19 – 2016-11-20 (×2): via INTRAVENOUS
  Filled 2016-11-19 (×4): qty 150

## 2016-11-19 MED ORDER — ONDANSETRON HCL 4 MG PO TABS: 4.0000 mg | ORAL_TABLET | Freq: Four times a day (QID) | ORAL | Status: DC | PRN

## 2016-11-19 NOTE — Consult Note (Signed)
PULMONARY / CRITICAL CARE MEDICINE   Name: Jesus [name] MRN: [id number] DOB: December 30, 1965    ADMISSION DATE:  10/28/2016 CONSULTATION DATE:  11/18/2016  REFERRING MD:  D. Robb MatarOrtiz  CHIEF COMPLAINT:  Management of hepatorenal syndrome  brief  Hx is per the H&P as the patient is currently confused with POC glu 18. This 51 y.o. B M is [facility] management of hepatorenal syndrome. The patient, with  a hx chronic hepatitis B, liver cirrhosis and UGI bleed, [facility] with c/o of 1 week of progressively worsening abdominal pain. He reportedly denied fever, N, V, hematechezia. He was afebrile but with a WBC of 18.2 he was initiated on Rocephin for empiric coverage of SBP. LFTs were pertinent for increased AST and bili of 14.2; ammonia 16 and albumin 1.6. Abdominal CT showed small effusion right base with atelectasis, moderate ascites with edema in the abdominal wall and thickening of the bowel wall. With systoloic BPS overnight in the 80-90's, he was started on Neosynephrine.  pmhx mach 2017 GI notes EGD 04/07/2015 which revealed a linear esophageal ulcer across GE junction to his lesser curvature consistent with Clayborne ArtistMallory Weiss tear. Normal upper third of esophagus and middle third of esophagus. Three small antral ulcers and 10mm prepyloric ulcer noted without stigmata of bleed. No specimen collected.  Found to be H. Pylori positive and started on Pylera. Samples given from our office.  He was also found to be Hepatitis B postive. [name] apparently was not collected.  04/12/2015 He also underwent a lap cholecystectomy on 04/12/2015 for cholecystitis, cholelithiasis.  Patient refused liver biopsy  STUDIES:  Abd CT as above  CULTURES: None  ANTIBIOTICS: Rocephin empiric for SBP  SIGNIFICANT EVENTS: None  LINES/TUBES: R IJ 10/27    SUBJECTIVE/OVERNIGHT/INTERVAL HX 11/19/2016 - levophed 40 and neo 110. Worsenign ATN. Ongoing encephlaopathy.  Severe jaundice and coagulopathy. Not intubated. 3rd spacing + . 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 Does not communicate much with family. Always in denial and on road or elsewhere.. Does not take ETOH. Has Hep B. Not on transplant list and daughter thinks that would not be c/w his goals or lifestyle.  Brother also affirmed this and also added patient non compliant with meds and medical advice    VITAL SIGNS: BP (!) 109/55   Pulse (!) 104   Temp 97.9 F (36.6 C) (Oral)   Resp (!) 26   Ht 5\' 9"  (1.753 m)   Wt (!) 181.5 kg (400 lb 2.2 oz)   SpO2 96%   BMI 59.09 kg/m   HEMODYNAMICS: CVP:  [14 mmHg] 14 mmHgHemodyn stable on Neosyn  VENTILATOR SETTINGS:  Breathing spontaneously on RA with sats 100%  INTAKE / OUTPUT: I/O last 3 completed shifts: In: 7549.6 [I.V.:5056.6; Blood:1243; IV Piggyback:1250] Out: 5 [Urine:5]  PHYSICAL EXAMINATION:  General Appearance:    Looks criticall ill OBESE - + Volime overloaded +  Head:    Normocephalic, without obvious abnormality, atraumatic  Eyes:    PERRL - yes, conjunctiva/corneas - JAUNDICE      Ears:    Normal external ear canals, both ears  Nose:   NG tube - none  Throat:  ETT TUBE - no , OG tube - none  Neck:   Supple,  No enlargement/tenderness/nodules     Lungs:     Clear to auscultation bilaterally, but mildly  tachypneic  Chest wall:    No deformity  Heart:    S1 and S2 normal, no murmur, CVP - na.  Pressors - on 2  Abdomen:     Soft, no masses, no organomegaly  Genitalia:    Not done  Rectal:   not done  Extremities:   Extremities- edematous +++     Skin:   Intact in exposed areas .      Neurologic:   Sedation - none -> RASS - 0 to -1 . Moves all 4s - yes. CAM-ICU - + for delirium . Orientation - confused and muttering       LABS: PULMONARY  Recent Labs Lab 11/18/16 1427  PHART 7.351  PCO2ART 37.2  PO2ART 82.0*  HCO3 20.6   TCO2 22  O2SAT 96.0    CBC  Recent Labs Lab 11/18/2016 2110 11/19/16 0550  HGB 10.9* 7.9*  HCT 31.6* 23.3*  WBC 18.2* 14.6*  PLT 75* 61*    COAGULATION  Recent Labs Lab 11/18/16 1437 11/19/16 0200  INR 8.28* 9.60*    CARDIAC  No results for input(s): TROPONINI in the last 168 hours. No results for input(s): PROBNP in the last 168 hours.   CHEMISTRY  Recent Labs Lab 11/14/2016 2024 11/18/16 1437 11/19/16 0022 11/19/16 0550  NA 125* 125* 125* 125*  K 4.9 5.3* 6.2* 5.0  CL 94* 95* 97* 95*  CO2 23 19* 15* 17*  GLUCOSE 72 84 64* 88  BUN 37* 38* 37* 37*  CREATININE 2.64* 3.69* 4.16* 4.45*  CALCIUM 8.7* 8.5* 8.4* 8.4*  MG  --  2.2  --   --    Estimated Creatinine Clearance: 32.3 mL/min (A) (by C-G formula based on SCr of 4.45 mg/dL (H)).   LIVER  Recent Labs Lab 11/05/2016 2024 11/18/16 1437 11/19/16 0200  AST 261*  --   --   ALT 90*  --   --   ALKPHOS 136*  --   --   BILITOT 14.2*  --   --   PROT 6.4*  --   --   ALBUMIN 1.6*  --   --   INR  --  8.28* 9.60*     INFECTIOUS No results for input(s): LATICACIDVEN, PROCALCITON in the last 168 hours.   ENDOCRINE CBG (last 3)   Recent Labs  11/19/16 0423 11/19/16 0556 11/19/16 0832  GLUCAP 75 89 179*         IMAGING x48h  - image(s) personally visualized  -   highlighted in bold Ct Abdomen Pelvis Wo Contrast  Result Date: 11/18/2016 CLINICAL DATA:  Acute onset of generalized abdominal pain and black stools. Initial encounter. EXAM: CT ABDOMEN AND PELVIS WITHOUT CONTRAST TECHNIQUE: Multidetector CT imaging of the abdomen and pelvis was performed following the standard protocol without IV contrast. COMPARISON:  Right upper quadrant ultrasound performed 04/26/2015 FINDINGS: Lower chest: Trace right-sided pleural fluid is noted, with associated atelectasis. Hepatobiliary: The nodular contour of the liver is compatible with hepatic cirrhosis. The patient is status post cholecystectomy, with clips  noted at the gallbladder fossa. The common bile duct is grossly unremarkable in appearance. Pancreas: The pancreas is within normal limits. Spleen: The spleen is unremarkable in appearance. Adrenals/Urinary Tract: The adrenal glands are unremarkable in appearance. The kidneys are grossly unremarkable. There is no evidence of hydronephrosis. No renal or ureteral stones are identified. No perinephric stranding is appreciated. Stomach/Bowel: Mild wall thickening is noted along the ascending colon, which could reflect a mild infectious or  inflammatory process. There is also jejunal wall thickening, of uncertain significance. Remaining small bowel is grossly unremarkable. The stomach is grossly unremarkable in appearance. Vascular/Lymphatic: The abdominal aorta is unremarkable in appearance. The inferior vena cava is grossly unremarkable. No retroperitoneal lymphadenopathy is seen. No pelvic sidewall lymphadenopathy is identified. Reproductive: The bladder is decompressed and not well assessed. The prostate is normal in size. Other: Moderate ascites is noted within the abdomen and pelvis. Soft tissue edema is noted along the abdominal and pelvic wall, concerning for anasarca. Musculoskeletal: No acute osseous abnormalities are identified. 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 The visualized musculature is unremarkable in appearance. IMPRESSION: 1. Mild wall thickening along the ascending colon, which could reflect a mild infectious or inflammatory process. Jejunal wall thickening also noted. 2. Moderate volume ascites within the abdomen and pelvis. 3. Findings of hepatic cirrhosis. 4. Trace right-sided pleural fluid, with associated atelectasis. 5. Soft tissue edema along the abdominal and pelvic wall, concerning for anasarca. 6. Chronic bilateral pars defects at L5, without evidence of anterolisthesis. Electronically Signed   By: Roanna Raider M.D.   On: 11/18/2016 00:59    Dg Chest 2 View  Result Date: 12/14/16 CLINICAL DATA:  Liver failure EXAM: CHEST  2 VIEW COMPARISON:  04/07/2015 FINDINGS: Low lung volumes. Small pleural effusions. Cardiomegaly with central vascular congestion and mild pulmonary edema. Mild right base opacity versus small infiltrate. IMPRESSION: 1. Low lung volumes 2. Small pleural effusions with right base atelectasis or small infiltrate 3. Cardiomegaly with vascular congestion and mild pulmonary edema. Electronically Signed   By: Jasmine Pang M.D.   On: 12/14/16 22:35   Dg Chest Port 1 View  Result Date: 11/19/2016 CLINICAL DATA:  Central line placement EXAM: PORTABLE CHEST 1 VIEW COMPARISON:  12-14-2016 FINDINGS: Right-sided central venous catheter tip overlies the mid to low right atrium. No pneumothorax. Cardiomegaly with vascular congestion. No focal consolidation. Moderate gastric enlargement IMPRESSION: 1. Right-sided central venous catheter tip overlies the mid to low right atrium. Negative for a right pneumothorax 2. Cardiomegaly with vascular congestion 3. Enlarged stomach Electronically Signed   By: Jasmine Pang M.D.   On: 11/19/2016 01:43      DISCUSSION: 51 y.o. 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 Initial management steps are appropriate. Will arrange for IR-performed paracentesis. Will withhold diuretics until more hemodynamically stable off pressors.  ASSESSMENT / PLAN:  PULMONARY A: No acute pulmonary process per chest imaging, but with altered MS will assess ventilatory status. At high risk for intubation 11/19/2016  P:   Monitor closely  CARDIOVASCULAR A:  Severe circulatory shock with multiple pressor need 11/19/2016  P:  Neo Levophed Add vaso 0.03 MAP goal > 65  RENAL A:   ATN due to Hepatorenal sydnrome - rapidly worsenign. Very ppor prognosis   P:   Start bic gtt Try to maintain BP Not a good candidate for CRRT Check lactate   GASTROINTESTINAL A:   Severe jaundice and  liver failure On octreotide, midrodrine and PPI P:   Continue and schedule paracentesis when able  HEMATOLOGIC  Recent Labs Lab 12-14-2016 2110 11/19/16 0550  HGB 10.9* 7.9*  HCT 31.6* 23.3*  WBC 18.2* 14.6*  PLT 75* 61*    Recent Labs Lab 11/18/16 1437 11/19/16 0200  INR 8.28* 9.60*    A:   Anemia and TCP, secondary to liver dz Coagulopathic 11/19/2016   P:  - PRBC for hgb </= 6.9gm%    - exceptions  are   -  if ACS susepcted/confirmed then transfuse for hgb </= 8.0gm%,  or    -  active bleeding with hemodynamic instability, then transfuse regardless of hemoglobin value   At at all times try to transfuse 1 unit prbc as possible with exception of active hemorrhage    INFECTIOUS A:   R/o SBP P:   Empiric cetriaxole  ENDOCRINE A:   Hypoglycemia. Has received D50W x2   P:   Change IV to D5NS  NEUROLOGIC A:   Acute enceophalopahyt + P:   Place cortrak (no hx of varices) Start lactulose and rifampin  FAMILY  Interdisciplinary Goals of Care Family Meeting   Date carried out:: 11/19/2016  Location of the meeting: Bedside  Member's involved: Physician, Bedside Registered Nurse, Family Member or next of kin and Other: daughter and brother and brother'swife  Durable Power of Pensions consultant or acting medical decision maker: Brother and daughter    Discussion: We discussed goals of care for [employer] .  Explained liver failure and MODS  Code status: Limited Code or DNR with short term  Disposition: Continue current acute care + no HD,CRRT. + no intubaton + no mech ventilation + no CPR. No further pressors. Essentially do basic ICU medical care + concurrent palliation and if he gets worse, total palliation  Time spent for the meeting: 15 min  Zaheer Wageman 11/19/2016, 10:58 AM     The patient is critically ill with multiple organ systems failure and requires high complexity decision making for assessment and support, frequent evaluation and  titration of therapies, application of advanced monitoring technologies and extensive interpretation of multiple databases.   Critical Care Time devoted to patient care services described in this note is  45  Minutes. This time reflects time of care of this signee Dr Kalman Shan. This critical care time does not reflect procedure time, or teaching time or supervisory time of PA/NP/Med student/Med Resident etc but could involve care discussion time    Dr. Kalman Shan, M.D., [facility].C.P Pulmonary and Critical Care Medicine Staff Physician [facility] [place] Pulmonary and Critical Care Pager: [phone], If no answer or between  15:00h - 7:00h: call [phone]  11/19/2016 10:04 AM

## 2016-11-19 NOTE — Progress Notes (Signed)
Family had requested AD Mental Health.  I went by and met family.  They said it was too late to fill out forms. They introduced patients daughter and she said it is too late now.  There were many family members in the large waiting area of 2nd Floor heart.  I apologized that it was too late. We cannot complete these on the weekend as we do not have notary or witnesses available.   Family was very receptive to prayer together. Conard Novak, Chaplain    11/19/16 1700  Clinical Encounter Type  Visited With Family  Visit Type Initial;Social support  Consult/Referral To Chaplain  Recommendations (wanted Mental health AD)  Spiritual Encounters  Spiritual Needs Prayer;Emotional  Stress Factors  Patient Stress Factors None identified  Family Stress Factors None identified

## 2016-11-19 NOTE — Progress Notes (Signed)
eLink Physician-Brief Progress Note Patient Name: Jesus [name] DOB: [birth year] MRN: [id number]   Date of Service  11/19/2016  HPI/Events of Note  Multiple issues: 1. K+ = 6.2, 2. Hypotension - Phenylephrine IV infusion running at 400 mcg/min and 3. Review CXR for central line placement. R IJ CVL tip in R atrium. No pneumothorax.   eICU Interventions  Will order: 1. Kayexalate 30 gm per rectum now. 2. Norepinephrine IV infusion. Titrate to MAP > 65. 3. Wean Phenylephrine IV infusion off when Norepinephrine IV infusion is up and running.  4. Will notify ground team that CVL tip is in R atrium.  5. Monitor CVP per protocol.  .   Intervention Category Major Interventions: Electrolyte abnormality - evaluation and management;Hypotension - evaluation and management  [name] 11/19/2016, 2:09 AM

## 2016-11-19 NOTE — Progress Notes (Signed)
Family contacted [facility] [employer] and notified about the patient's situation. Pt's family member is [employer] and requesting come to spend time with the patient.  Pt has poor prognosis from his diagnosis per MD, 3 pressors are on board.  If situation worsening to call [facility] [phone] reference case number is [id number].

## 2016-11-19 NOTE — Progress Notes (Signed)
eLink Physician-Brief Progress Note Patient Name: Jesus [name] DOB: 1965-09-04 MRN: [id number]   Date of Service  11/19/2016  HPI/Events of Note  Nursing unable to give Kayexalate as patient is not cooperative.   eICU Interventions  Will order: 1. D50 1 amp IV now.  2. Novolog insulin 10 units IV now.      Intervention Category Major Interventions: Electrolyte abnormality - evaluation and management  Sommer,[name] 11/19/2016, 4:26 AM

## 2016-11-19 NOTE — Procedures (Signed)
Central line placement  Indications: high dose vasopressors   Rt IJ  Complete sterile conditions US guided placement 1st attempt, no complications CXR is pending  Oswaldo MilianAkram Zaaqoq, MD CCM attending

## 2016-11-19 NOTE — Progress Notes (Signed)
Spoke to Dr. Elmer Pickeramaswamu about critical results (LA 4.0, INR 5.21, APTT 71) as well as blood sugars in 200s. See new orders

## 2016-11-19 NOTE — Plan of Care (Signed)
Problem: Education: Goal: Knowledge of [facility] Education information/materials will improve Outcome: Not Progressing Confused   Problem: Health Behavior/Discharge Planning: Goal: Ability to manage health-related needs will improve Outcome: Not Progressing Confused  Problem: Pain Managment: Goal: General experience of comfort will improve Outcome: Not Progressing Pt is unconcious

## 2016-11-19 NOTE — Clinical Social Work Note (Signed)
CSW acknowledges consult, "Possibly may need help filling prescriptions upon discharge." Please consult RNCM.  CSW signing off. Consult again if any social work needs arise.  Charlynn CourtSarah Lynnix Schoneman, CSW [phone]

## 2016-11-20 DIAGNOSIS — Z515 Encounter for palliative care: Secondary | ICD-10-CM

## 2016-11-20 DIAGNOSIS — R578 Other shock: Secondary | ICD-10-CM

## 2016-11-20 LAB — CBC WITH DIFFERENTIAL/PLATELET
BASOS PCT: 1 %
Basophils Absolute: 0.1 10*3/uL (ref 0.0–0.1)
EOS ABS: 0.1 10*3/uL (ref 0.0–0.7)
Eosinophils Relative: 1 %
HEMATOCRIT: 18 % — AB (ref 39.0–52.0)
Hemoglobin: 6.3 g/dL — CL (ref 13.0–17.0)
LYMPHS ABS: 2.5 10*3/uL (ref 0.7–4.0)
LYMPHS PCT: 21 %
MCH: 36.8 pg — ABNORMAL HIGH (ref 26.0–34.0)
MCHC: 35 g/dL (ref 30.0–36.0)
MCV: 105.3 fL — ABNORMAL HIGH (ref 78.0–100.0)
MONO ABS: 1.8 10*3/uL — AB (ref 0.1–1.0)
Monocytes Relative: 15 %
Neutro Abs: 7.5 10*3/uL (ref 1.7–7.7)
Neutrophils Relative %: 62 %
Platelets: 46 10*3/uL — ABNORMAL LOW (ref 150–400)
RBC: 1.71 MIL/uL — ABNORMAL LOW (ref 4.22–5.81)
RDW: 16.5 % — AB (ref 11.5–15.5)
WBC: 12 10*3/uL — ABNORMAL HIGH (ref 4.0–10.5)

## 2016-11-20 LAB — PROCALCITONIN: Procalcitonin: 1.31 ng/mL

## 2016-11-20 LAB — GLUCOSE, CAPILLARY
GLUCOSE-CAPILLARY: 123 mg/dL — AB (ref 65–99)
GLUCOSE-CAPILLARY: 21 mg/dL — AB (ref 65–99)
Glucose-Capillary: 123 mg/dL — ABNORMAL HIGH (ref 65–99)
Glucose-Capillary: 137 mg/dL — ABNORMAL HIGH (ref 65–99)
Glucose-Capillary: 130 mg/dL — ABNORMAL HIGH (ref 65–99)

## 2016-11-20 LAB — BPAM FFP
BLOOD PRODUCT EXPIRATION DATE: 201811012359
Blood Product Expiration Date: 201810312359
Blood Product Expiration Date: 201810312359
Blood Product Expiration Date: 201811022359
ISSUE DATE / TIME: 201810261011
ISSUE DATE / TIME: 201810280103
ISSUE DATE / TIME: 201810280413
ISSUE DATE / TIME: 201810280905
UNIT TYPE AND RH: 5100
Unit Type and Rh: 5100
Unit Type and Rh: 6200
Unit Type and Rh: 9500

## 2016-11-20 LAB — COMPREHENSIVE METABOLIC PANEL
ALBUMIN: 2.3 g/dL — AB (ref 3.5–5.0)
ALK PHOS: 117 U/L (ref 38–126)
ALT: 824 U/L — AB (ref 17–63)
AST: 2884 U/L — AB (ref 15–41)
Anion gap: 11 (ref 5–15)
BUN: 40 mg/dL — AB (ref 6–20)
CALCIUM: 8.3 mg/dL — AB (ref 8.9–10.3)
CO2: 19 mmol/L — AB (ref 22–32)
CREATININE: 5.04 mg/dL — AB (ref 0.61–1.24)
Chloride: 95 mmol/L — ABNORMAL LOW (ref 101–111)
GFR calc Af Amer: 14 mL/min — ABNORMAL LOW (ref >60)
GFR calc non Af Amer: 12 mL/min — ABNORMAL LOW (ref >60)
GLUCOSE: 141 mg/dL — AB (ref 65–99)
Potassium: 5.5 mmol/L — ABNORMAL HIGH (ref 3.5–5.1)
SODIUM: 125 mmol/L — AB (ref 135–145)
Total Bilirubin: 17.6 mg/dL — ABNORMAL HIGH (ref 0.3–1.2)
Total Protein: 5.1 g/dL — ABNORMAL LOW (ref 6.5–8.1)

## 2016-11-20 LAB — PREPARE FRESH FROZEN PLASMA
UNIT DIVISION: 0
UNIT DIVISION: 0
UNIT DIVISION: 0
Unit division: 0

## 2016-11-20 LAB — PROTIME-INR: Prothrombin Time: >90 seconds — ABNORMAL HIGH (ref 11.4–15.2)

## 2016-11-20 LAB — APTT: aPTT: >200 seconds (ref 24–36)

## 2016-11-20 LAB — PREPARE RBC (CROSSMATCH)

## 2016-11-20 LAB — HIV ANTIBODY (ROUTINE TESTING W REFLEX): HIV Screen 4th Generation wRfx: NONREACTIVE

## 2016-11-20 MED ORDER — NOREPINEPHRINE BITARTRATE 1 MG/ML IV SOLN
0.0000 ug/min | INTRAVENOUS | Status: DC
  Filled 2016-11-20: qty 4

## 2016-11-20 MED ORDER — SODIUM CHLORIDE 0.9 % IV SOLN: Freq: Once | INTRAVENOUS | Status: DC

## 2016-11-20 MED ORDER — MORPHINE SULFATE (PF) 2 MG/ML IV SOLN
2.0000 mg | INTRAVENOUS | Status: DC | PRN
  Administered 2016-11-20: 2 mg via INTRAVENOUS
  Filled 2016-11-20 (×2): qty 1

## 2016-11-21 LAB — BPAM RBC
BLOOD PRODUCT EXPIRATION DATE: 201811212359
ISSUE DATE / TIME: 201810291013
UNIT TYPE AND RH: 5100

## 2016-11-21 LAB — TYPE AND SCREEN
ABO/RH(D): O POS
ANTIBODY SCREEN: NEGATIVE
UNIT DIVISION: 0

## 2016-11-23 NOTE — Progress Notes (Signed)
   11/12/2016 1047  Clinical Encounter Type  Visited With Patient and family together  Visit Type Initial  Referral From Family  Consult/Referral To Chaplain  Spiritual Encounters  Spiritual Needs Emotional;Prayer  Stress Factors  Family Stress Factors Health changes   Encountered the brother of the patient in the hall.  Brother was very disturbed and we talked.  I visited with the patient and family.  Patient not very alert.  Provided some comfort and emotional support for the family.  Will follow as needed. Chaplain Agustin CreeNewton Kylene Zamarron

## 2016-11-23 NOTE — Progress Notes (Signed)
eLink Physician-Brief Progress Note Patient Name: Jesus [name] DOB: January 06, 1966 MRN: [id number]   Date of Service  11/01/2016  HPI/Events of Note  INR has been elevated d/t liver disease.   eICU Interventions  Will order: 1. PT/INR now.  2. PTT now.      Intervention Category Intermediate Interventions: Coagulopathy - evaluation and management  [name] 10/24/2016, 6:32 AM

## 2016-11-23 NOTE — Progress Notes (Signed)
[place]uckyElson AreasVal Riles39[facility]60yoKessler Institute For Rehabilitation58mMalissa [name] Jesus [name] [phone] SamplesElson AreasVal Riles62[facility] LLCOzella Rocks18yoTr[facility]as [facility]55mMalissa [name] Jesus PalauArvinMer[employer] [id number]%[phone] SamplesElson AreasVal Riles38[facility] Hospital[facility]ella [facility]ocks62yoL[facility]69mMalissa [name] Jesus PalauArvinMeritor 16-10%[phone] SamplesElson AreasVal Riles69[facility] LLCOzella Rocks53yoO[facility] Hospital67mMalissa [name] Jesus PalauArvinMeritor 16-10%[phone] SamplesElson AreasVal Riles57  2110 11/19/16 0550 September 20, 2016 0600  HGB 10.9* 7.9* 6.3*  HCT 31.6* 23.3* 18.0*  WBC 18.2* 14.6* 12.0*  PLT 75* 61* 46*    COAGULATION  Recent Labs Lab 11/18/16 1437 11/19/16 0200 11/19/16 1149  INR 8.28* 9.60* 5.21*    CARDIAC  No results for input(s): TROPONINI in the last 168 hours. No results for input(s): PROBNP in the last 168 hours.   CHEMISTRY  Recent Labs Lab 11/18/2016 2024  11/18/16 1437 11/19/16 0022 11/19/16 0550 11/19/16 1149 September 20, 2016 0600  NA 125* 125* 125* 125*  --  125*  K 4.9 5.3* 6.2* 5.0  --  5.5*  CL 94* 95* 97* 95*  --  95*  CO2 23 19* 15* 17*  --  19*  GLUCOSE 72 84 64* 88  --  141*  BUN 37* 38* 37* 37*  --  40*  CREATININE 2.64* 3.69* 4.16* 4.45*  --  5.04*  CALCIUM 8.7* 8.5* 8.4* 8.4*  --  8.3*  MG  --  2.2  --   --  2.0  --   PHOS  --   --   --   --  6.2*  --    Estimated Creatinine Clearance: 29 mL/min (A) (by C-G formula based on SCr of 5.04 mg/dL (H)).   LIVER  Recent Labs Lab 11/12/2016 2024 11/18/16 1437 11/19/16 0200 11/19/16 1149 September 20, 2016 0600  AST 261*  --   --   --  2,884*  ALT 90*  --   --   --  824*  ALKPHOS 136*  --   --   --  117  BILITOT 14.2*  --   --   --  17.6*  PROT 6.4*  --   --   --  5.1*  ALBUMIN 1.6*  --   --   --  2.3*  INR  --  8.28* 9.60* 5.21*  --      INFECTIOUS  Recent Labs Lab 11/19/16 1149 11/19/16 1150 September 20, 2016 0600  LATICACIDVEN  --  4.0*  --   PROCALCITON 1.40  --  1.31     ENDOCRINE CBG (last 3)   Recent Labs  September 20, 2016 0015 September 20, 2016 0306 September 20, 2016 0830  GLUCAP 123* 123* 137*         IMAGING x48h  - image(s) personally visualized  -   highlighted in bold Dg Chest Port 1 View  Result Date: 11/19/2016 CLINICAL DATA:  Central line placement EXAM: PORTABLE CHEST 1 VIEW COMPARISON:  10/30/2016 FINDINGS: Right-sided central venous catheter tip overlies the mid to low right atrium. No pneumothorax. Cardiomegaly with vascular congestion. No focal consolidation. Moderate gastric enlargement IMPRESSION: 1. Right-sided central venous catheter tip overlies the mid to low right atrium. Negative for a right pneumothorax 2. Cardiomegaly with vascular congestion 3. Enlarged stomach Electronically Signed   By: Jesus [name]  [name] M.D.   On: 11/19/2016 01:43   [place] Ascites (abdomen Limited)  Result Date: 11/19/2016 CLINICAL DATA:  Ascites. Clinical concern of spontaneous bacterial  peritonitis. EXAM: LIMITED ABDOMEN ULTRASOUND FOR ASCITES TECHNIQUE: Limited ultrasound survey for ascites was performed in all four abdominal quadrants. COMPARISON:  None. FINDINGS: A small to moderate amount of scattered abdominal ascites was identified on limited survey ultrasound interrogation of the peritoneal cavity. After discussion of findings with Jesus [name], paracentesis was deferred. IMPRESSION: 1. Scattered abdominal ascites.  Paracentesis deferred. Electronically Signed   By: Jesus [name]  Jesus M.D.   On: 11/19/2016 12:41      DISCUSSION: 51 y.o. 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 Continued decline with MODS despite aggressive care.  ASSESSMENT / PLAN:  Principal Problem:   Acute and subacute hepatic failure without coma Active Problems:   UGI bleed   Thrombocytopenia (HCC)   Macrocytic anemia   Hepatorenal syndrome (HCC)   Hyponatremia   Leukocytosis   Acute liver failure  Supratherapeutic INR  Long discussion with family this AM including his son and daughter. They are aware that he continues to decline. Limitations have previously been set that he would not want intubation, CPR, dialysis, and no escalation or pressors. With this in mind, they elect to transition to total palliation.  Plan: DC vaso and neo. Keep levo until family ready for withdrawal, which will be DC'd at that time.  Ok to keep protonix, octreotide for now No further lab draws. No further transfusions.  PRN morphine for comfort, may need to escalate to infusion. Full DNR Chaplain services offered Comfort cart to bedside  Jesus [name], AGACNP-BC [place] Pulmonology/Critical Care Pager [phone] or [phone]  11/29/2016 1:21 PM  Attending Note:  51 year old male with liver failure due to hepatitis B presenting with liver failure, GI bleeding and is actively dying.  Patient is hemorrhaging out and is maxed out on 3 pressors.  On exam, he is arousable but clearly confused.   Had extensive conversations with the family.  After a long discussion, decision was made that patient would not want this level of care specially that there is very little that can be done for him.  They are awaiting family members to come in then will proceed with withdrawal and comfort care.  The patient is critically ill with multiple organ systems failure and requires high complexity decision making for assessment and support, frequent evaluation and titration of therapies, application of advanced monitoring technologies and extensive interpretation of multiple databases.   Critical Care Time devoted to patient care services described in this note is  35  Minutes. This time reflects time of care of this signee Dr Jesus [name]. This critical care time does not reflect procedure time, or teaching time or supervisory time of PA/NP/Med student/Med Resident etc but could involve care discussion time.  Jesus [name], M.D. [facility] Pulmonary/Critical Care Medicine. Pager: [phone]. After hours pager: [phone].

## 2016-11-23 DEATH — deceased

## 2016-11-28 ENCOUNTER — Telehealth: Payer: Self-pay

## 2016-11-28 NOTE — Telephone Encounter (Signed)
On 11/27/16 I [facility] The d/c is for cremation. The patient is a patient of Doctor Molli KnockYacoub. The d/c will be taken to Pulmonary Unit for Doctor McQuaid to sign the d/c since Doctor Molli KnockYacoub is on vacation this week.  On 11/28/16 I received the d/c back from Doctor McQuaid. I got the d/c ready and called the funeral home to let them know the d/c is ready for pickup. I also faxed a copy to the funeral home per the funeral home request.

## 2016-12-23 NOTE — Discharge Summary (Signed)
NAME:  Jesus [name], Jesus [name]                     ACCOUNT NO.:  MEDICAL RECORD NO.:  [id number]  LOCATION:                                 FACILITY:  PHYSICIAN:  Felipa EvenerWesam Jake Yacoub, MD       DATE OF BIRTH:  DATE OF ADMISSION: DATE OF DISCHARGE:                              DISCHARGE SUMMARY   DEATH SUMMARY  PRIMARY DIAGNOSIS/CAUSE OF DEATH:  Liver failure.  SECONDARY DIAGNOSES:  Acute respiratory failure, upper gastrointestinal bleed, thrombocytopenia, microcytic anemia, hepatorenal syndrome, hyponatremia, leukocytosis, hepatitis B, supratherapeutic INR, and hypercoagulability.  HOSPITAL COURSE:  The patient is a 51 year old male with past medical history significant for hepatitis B and liver failure, who presented to the hospital with a variceal bleed.  The patient developed hemorrhagic shock and transferred to ICU for further care.  In the intensive care unit, the patient was still arousable, but confused.  I had an extensive conversation with family who informed me that the patient was suffering with liver disease for many years and that he would not want any aggressive care.  After a long discussion, decision was made that the patient would not want this level of care.  Especially given that there is very little that can be done for him given his GI bleed, with consultation with GI, it is suggested that there is very little that can be done.  They were awaiting other family members to arrive in order to discontinue pressor, but they were agreeable with no further escalation of care at that point.  The pressor dose was maintained; however, the patient developed refractory shock and expired with the family at bedside.     Felipa EvenerWesam Jake Yacoub, MD     WJY/MEDQ  D:  11/24/2016  T:  11/25/2016  Job:  [id number]

## 2018-02-10 IMAGING — CT CT ABD-PELV W/O CM
2 of 4 series · 16 of 46 positions shown, 18 images · non-contrast
Comparison: Right upper quadrant ultrasound performed 04/26/2015

CLINICAL DATA: Acute onset of generalized abdominal pain and black
stools. Initial encounter.

EXAM:
CT ABDOMEN AND PELVIS WITHOUT CONTRAST
TECHNIQUE: Multidetector CT imaging of the abdomen and pelvis was performed
following the standard protocol without IV contrast.

[Series 2: axial st · axial · 0.97mm/px · z∈[+860,+1295]mm · 13 of 97 slices shown, 15 images]
[im 5/97  soft-tissue]
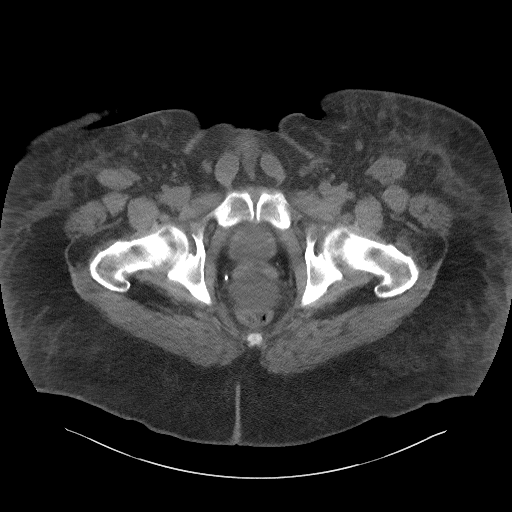
[im 5/97  bone]
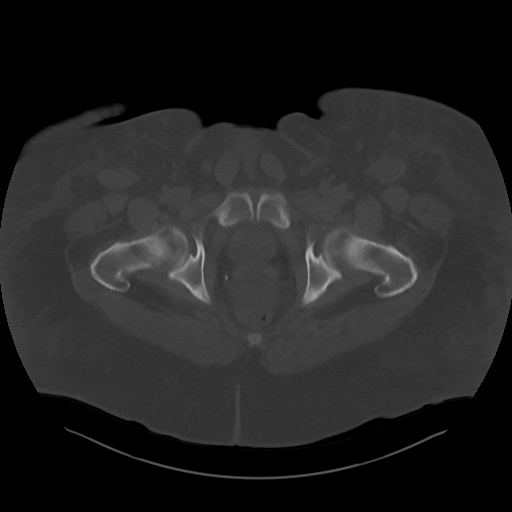
[im 13/97  soft-tissue]
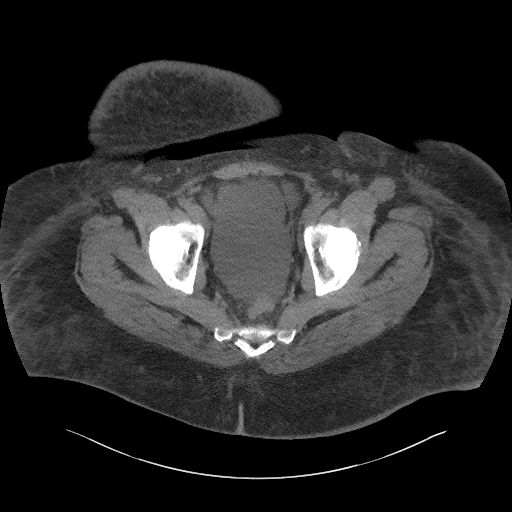
[im 21/97  soft-tissue]
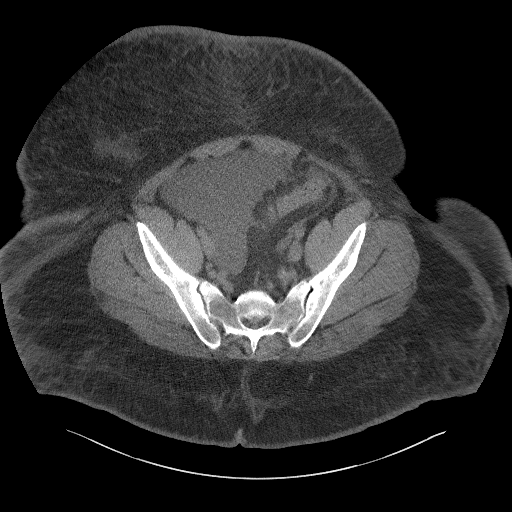
[im 26/97  soft-tissue]
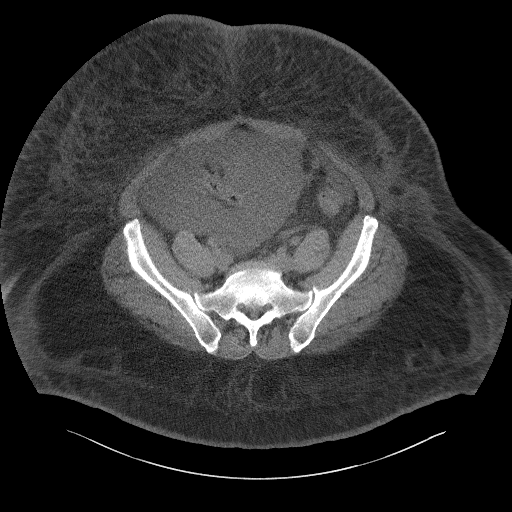
[im 34/97  soft-tissue]
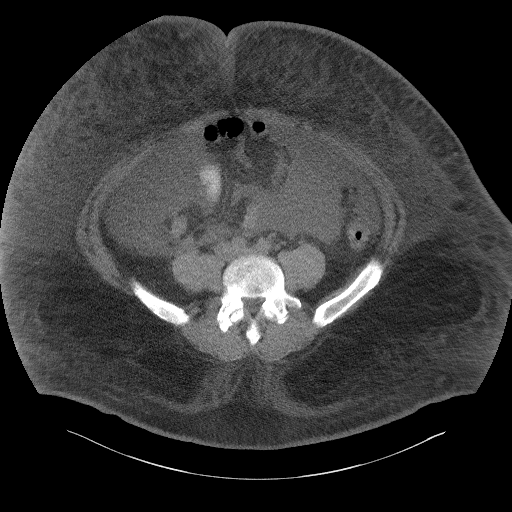
[im 42/97  soft-tissue]
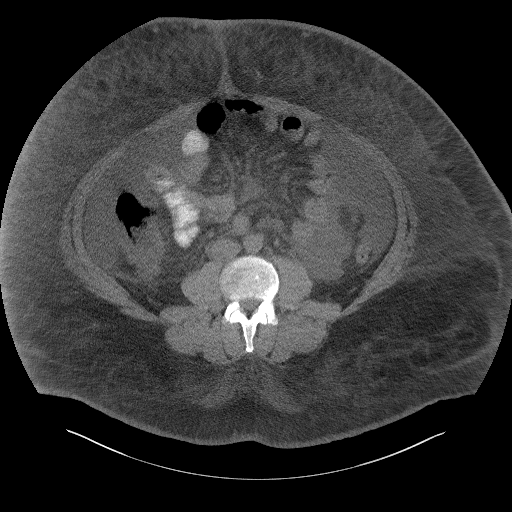
[im 51/97  soft-tissue]
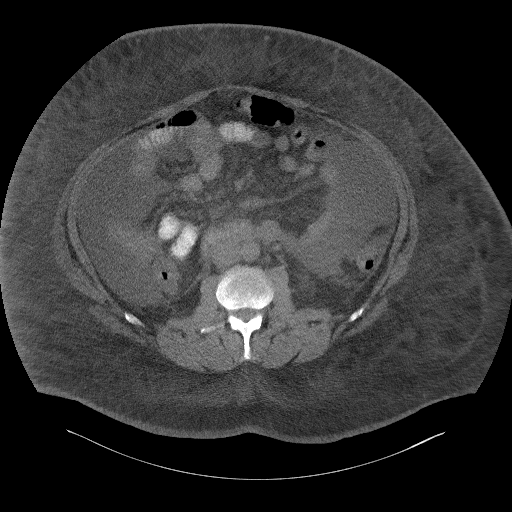
[im 55/97  soft-tissue]
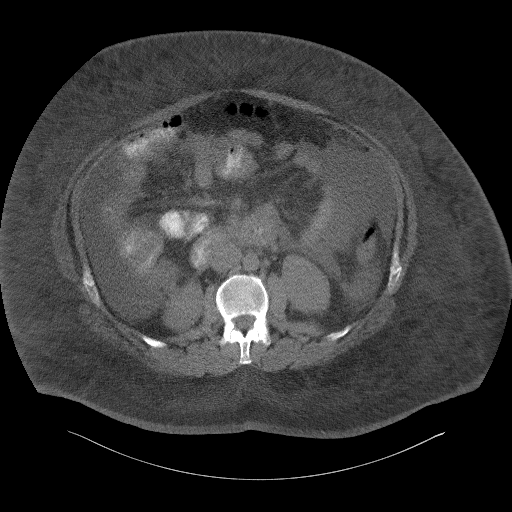
[im 63/97  soft-tissue]
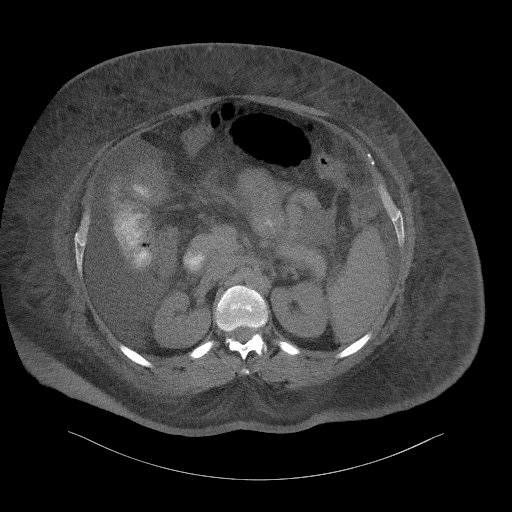
[im 63/97  bone]
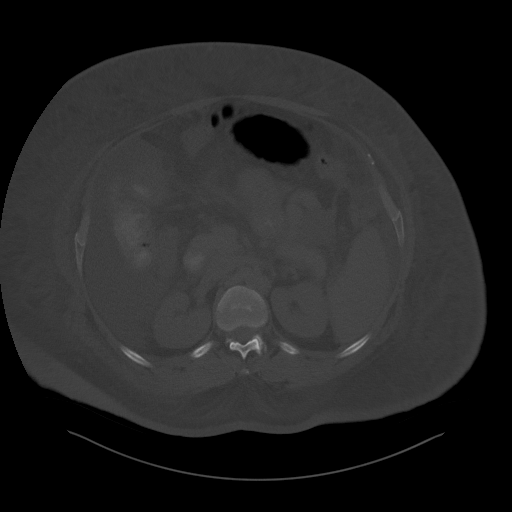
[im 71/97  soft-tissue]
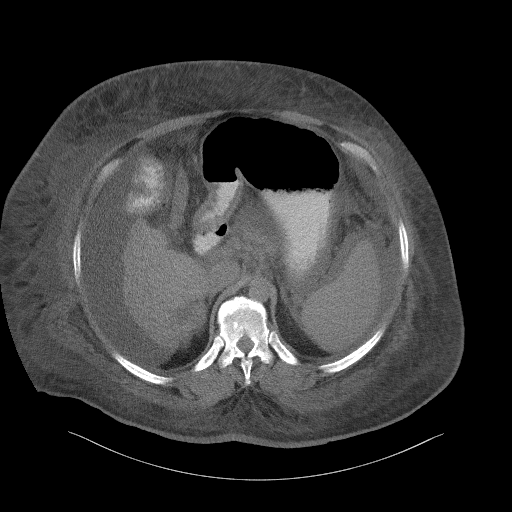
[im 76/97  soft-tissue]
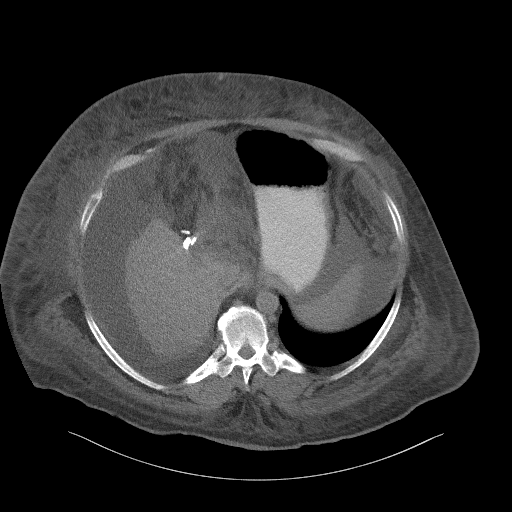
[im 84/97  soft-tissue]
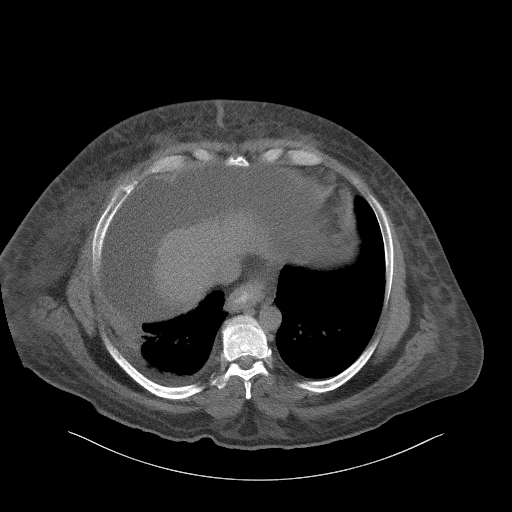
[im 92/97  soft-tissue]
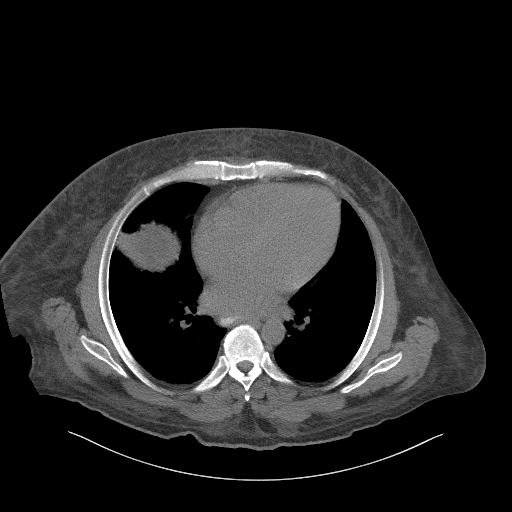

[Series 5: coronal st · coronal · 0.95mm/px · 3 of 154 slices shown]
[im 52/154  soft-tissue]
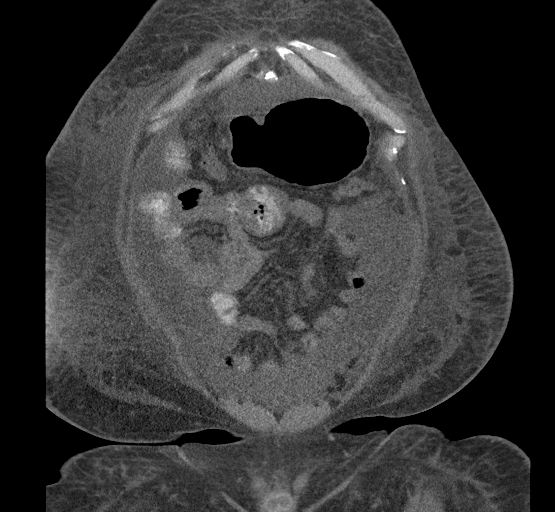
[im 69/154  soft-tissue]
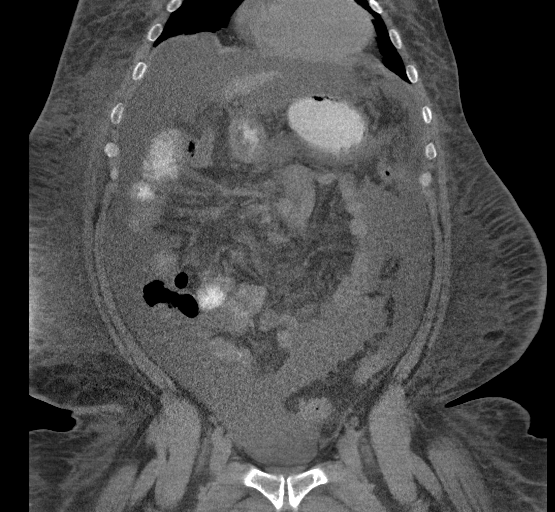
[im 86/154  soft-tissue]
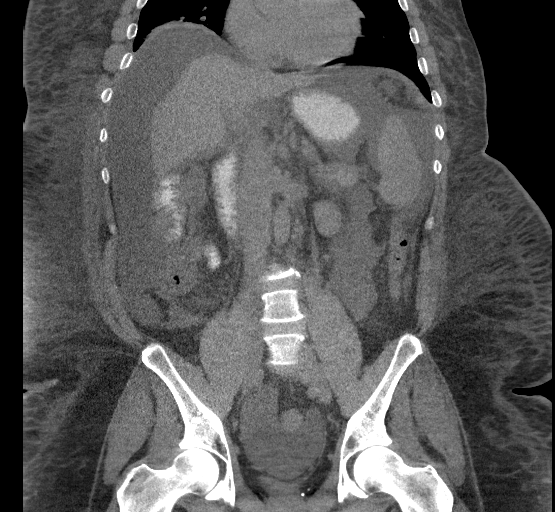

[16 of 46 positions shown; findings below may reference images not displayed]

FINDINGS: Lower chest: Trace right-sided pleural fluid is noted, with
associated atelectasis.

Hepatobiliary: The nodular contour of the liver is compatible with
hepatic cirrhosis. The patient is status post cholecystectomy, with
clips noted at the gallbladder fossa. The common bile duct is
grossly unremarkable in appearance.

Pancreas: The pancreas is within normal limits.

Spleen: The spleen is unremarkable in appearance.

Adrenals/Urinary Tract: The adrenal glands are unremarkable in
appearance.

The kidneys are grossly unremarkable. There is no evidence of
hydronephrosis. No renal or ureteral stones are identified. No
perinephric stranding is appreciated.

Stomach/Bowel: Mild wall thickening is noted along the ascending
colon, which could reflect a mild infectious or inflammatory
process.

There is also jejunal wall thickening, of uncertain significance.
Remaining small bowel is grossly unremarkable. The stomach is
grossly unremarkable in appearance.

Vascular/Lymphatic: The abdominal aorta is unremarkable in
appearance. The inferior vena cava is grossly unremarkable. No
retroperitoneal lymphadenopathy is seen. No pelvic sidewall
lymphadenopathy is identified.

Reproductive: The bladder is decompressed and not well assessed. The
prostate is normal in size.

Other: Moderate ascites is noted within the abdomen and pelvis. Soft
tissue edema is noted along the abdominal and pelvic wall,
concerning for anasarca.

Musculoskeletal: No acute osseous abnormalities are identified.
Chronic bilateral pars defects are seen at L5, without evidence of
anterolisthesis. The visualized musculature is unremarkable in
appearance.
IMPRESSION: 1. Mild wall thickening along the ascending colon, which could
reflect a mild infectious or inflammatory process. Jejunal wall
thickening also noted.
2. Moderate volume ascites within the abdomen and pelvis.
3. Findings of hepatic cirrhosis.
4. Trace right-sided pleural fluid, with associated atelectasis.
5. Soft tissue edema along the abdominal and pelvic wall, concerning
for anasarca.
6. Chronic bilateral pars defects at L5, without evidence of
anterolisthesis.

## 2018-02-11 IMAGING — CR DG CHEST 1V PORT
1 series · 1 of 1 positions shown · non-contrast
Comparison: 11/17/2016

CLINICAL DATA: Central line placement

EXAM:
PORTABLE CHEST 1 VIEW

[AP]
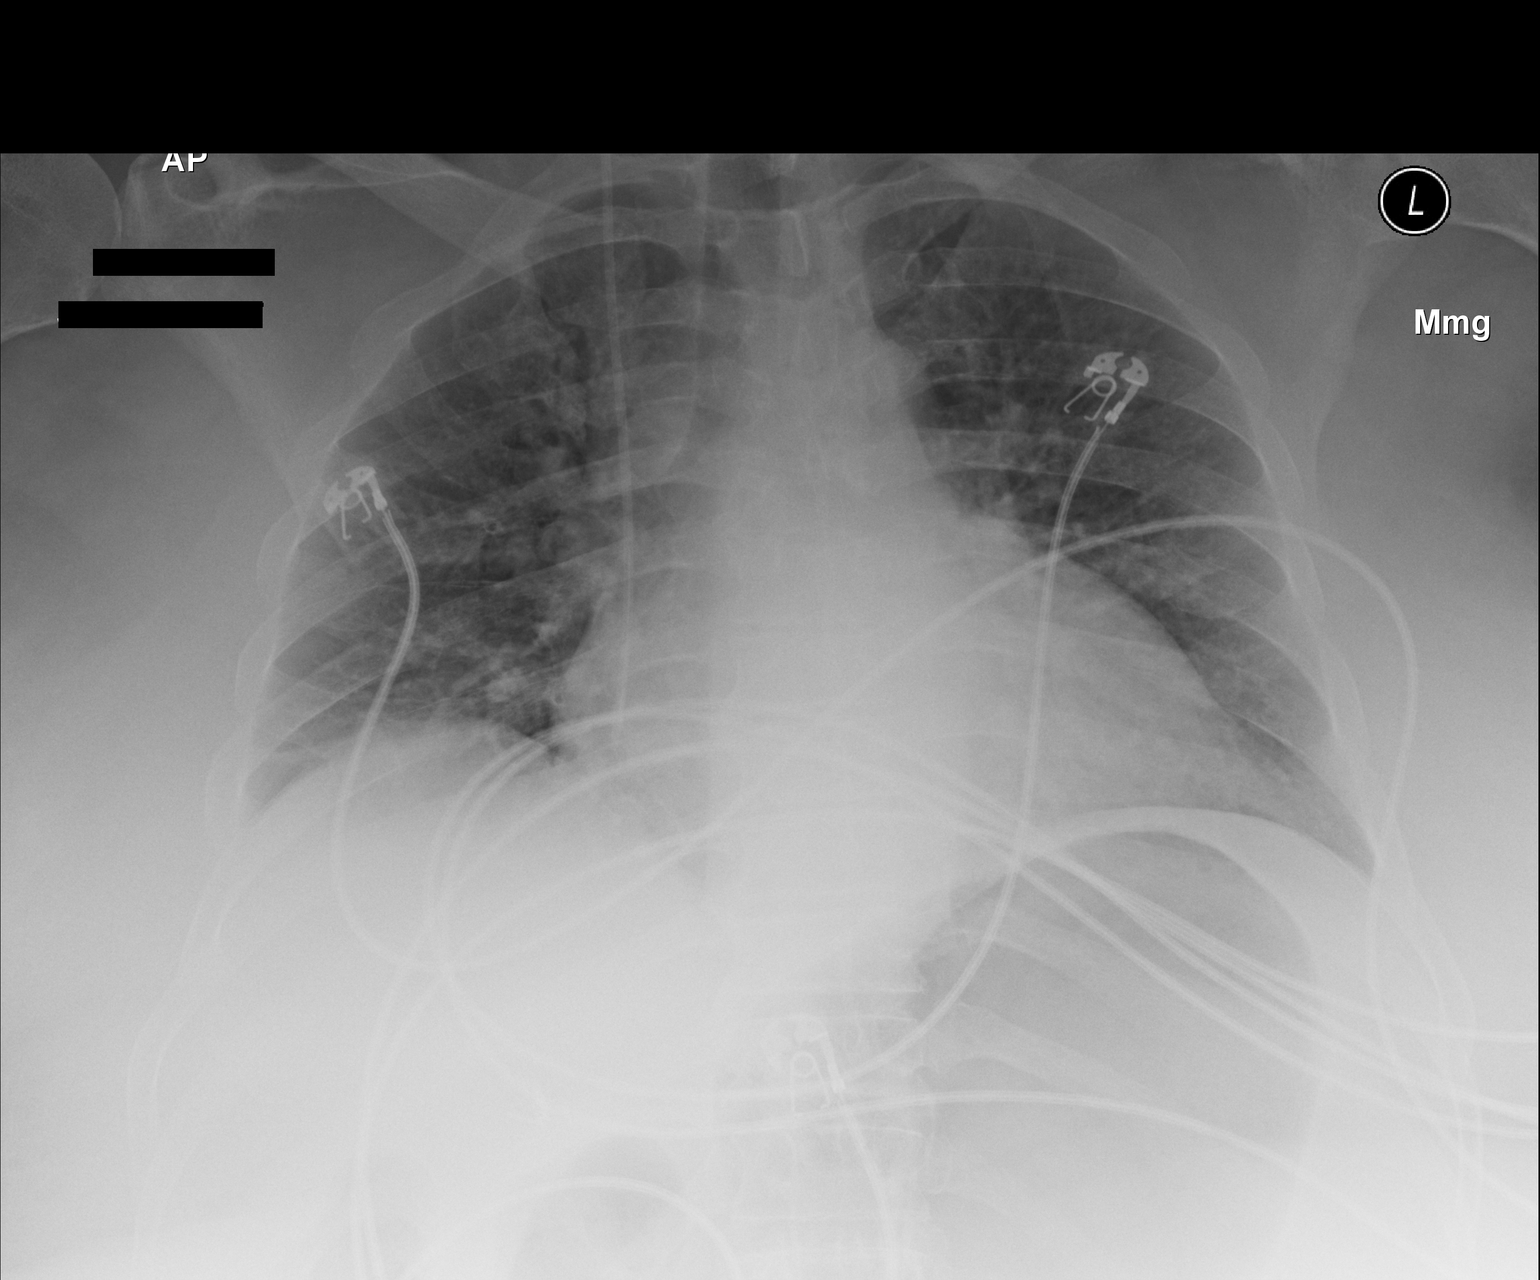

[1 of 1 positions shown; findings below may reference images not displayed]

FINDINGS: Right-sided central venous catheter tip overlies the mid to low
right atrium. No pneumothorax.

Cardiomegaly with vascular congestion. No focal consolidation.
Moderate gastric enlargement
IMPRESSION: 1. Right-sided central venous catheter tip overlies the mid to low
right atrium. Negative for a right pneumothorax
2. Cardiomegaly with vascular congestion
3. Enlarged stomach
